# Patient Record
Sex: Female | Born: 1940
Health system: Southern US, Community
[De-identification: ages and names within clinical notes are randomized; demographics above are authoritative.]

## PROBLEM LIST (undated history)

## (undated) DIAGNOSIS — R0600 Dyspnea, unspecified: Secondary | ICD-10-CM

## (undated) DIAGNOSIS — M545 Low back pain, unspecified: Secondary | ICD-10-CM

## (undated) DIAGNOSIS — M4312 Spondylolisthesis, cervical region: Secondary | ICD-10-CM

## (undated) DIAGNOSIS — F419 Anxiety disorder, unspecified: Secondary | ICD-10-CM

## (undated) DIAGNOSIS — R3129 Other microscopic hematuria: Secondary | ICD-10-CM

## (undated) DIAGNOSIS — K219 Gastro-esophageal reflux disease without esophagitis: Secondary | ICD-10-CM

## (undated) DIAGNOSIS — R002 Palpitations: Secondary | ICD-10-CM

## (undated) DIAGNOSIS — N6459 Other signs and symptoms in breast: Secondary | ICD-10-CM

## (undated) DIAGNOSIS — J45909 Unspecified asthma, uncomplicated: Secondary | ICD-10-CM

## (undated) DIAGNOSIS — N63 Unspecified lump in unspecified breast: Secondary | ICD-10-CM

## (undated) HISTORY — DX: Dyspnea, unspecified: R06.00

## (undated) HISTORY — DX: Gastro-esophageal reflux disease without esophagitis: K21.9

## (undated) HISTORY — DX: Spondylolisthesis, cervical region: M43.12

## (undated) HISTORY — DX: Anxiety disorder, unspecified: F41.9

## (undated) HISTORY — DX: Other microscopic hematuria: R31.29

## (undated) HISTORY — DX: Unspecified asthma, uncomplicated: J45.909

## (undated) HISTORY — DX: Low back pain, unspecified: M54.50

## (undated) HISTORY — DX: Low back pain: M54.5

## (undated) HISTORY — PX: TONSILLECTOMY: SUR1361

## (undated) HISTORY — DX: Palpitations: R00.2

## (undated) HISTORY — PX: CATARACT EXTRACTION: SUR2

---

## 2001-02-18 ENCOUNTER — Ambulatory Visit (HOSPITAL_COMMUNITY): Admission: RE | Admit: 2001-02-18 | Discharge: 2001-02-18 | Payer: Self-pay | Admitting: Internal Medicine

## 2001-02-18 ENCOUNTER — Encounter: Payer: Self-pay | Admitting: Internal Medicine

## 2001-11-13 DIAGNOSIS — R002 Palpitations: Secondary | ICD-10-CM

## 2001-11-13 HISTORY — DX: Palpitations: R00.2

## 2002-01-01 ENCOUNTER — Other Ambulatory Visit: Admission: RE | Admit: 2002-01-01 | Discharge: 2002-01-01 | Payer: Self-pay | Admitting: Obstetrics and Gynecology

## 2002-03-04 ENCOUNTER — Ambulatory Visit (HOSPITAL_COMMUNITY): Admission: RE | Admit: 2002-03-04 | Discharge: 2002-03-04 | Payer: Self-pay | Admitting: *Deleted

## 2002-03-04 ENCOUNTER — Encounter: Payer: Self-pay | Admitting: Specialist

## 2002-10-06 ENCOUNTER — Ambulatory Visit (HOSPITAL_COMMUNITY): Admission: RE | Admit: 2002-10-06 | Discharge: 2002-10-06 | Payer: Self-pay | Admitting: Internal Medicine

## 2003-03-16 ENCOUNTER — Ambulatory Visit (HOSPITAL_COMMUNITY): Admission: RE | Admit: 2003-03-16 | Discharge: 2003-03-16 | Payer: Self-pay | Admitting: *Deleted

## 2003-03-16 ENCOUNTER — Encounter: Payer: Self-pay | Admitting: Specialist

## 2003-07-21 ENCOUNTER — Ambulatory Visit (HOSPITAL_COMMUNITY): Admission: RE | Admit: 2003-07-21 | Discharge: 2003-07-21 | Payer: Self-pay | Admitting: Internal Medicine

## 2003-11-14 DIAGNOSIS — R0600 Dyspnea, unspecified: Secondary | ICD-10-CM

## 2003-11-14 HISTORY — DX: Dyspnea, unspecified: R06.00

## 2004-03-21 ENCOUNTER — Ambulatory Visit (HOSPITAL_COMMUNITY): Admission: RE | Admit: 2004-03-21 | Discharge: 2004-03-21 | Payer: Self-pay | Admitting: Specialist

## 2004-08-02 ENCOUNTER — Ambulatory Visit (HOSPITAL_COMMUNITY): Admission: RE | Admit: 2004-08-02 | Discharge: 2004-08-02 | Payer: Self-pay | Admitting: Internal Medicine

## 2004-10-20 ENCOUNTER — Ambulatory Visit (HOSPITAL_COMMUNITY): Admission: RE | Admit: 2004-10-20 | Discharge: 2004-10-20 | Payer: Self-pay | Admitting: Internal Medicine

## 2004-10-27 ENCOUNTER — Ambulatory Visit (HOSPITAL_COMMUNITY): Admission: RE | Admit: 2004-10-27 | Discharge: 2004-10-27 | Payer: Self-pay | Admitting: Internal Medicine

## 2004-10-27 ENCOUNTER — Ambulatory Visit: Payer: Self-pay | Admitting: *Deleted

## 2004-11-10 ENCOUNTER — Encounter (HOSPITAL_COMMUNITY): Admission: RE | Admit: 2004-11-10 | Discharge: 2004-11-11 | Payer: Self-pay | Admitting: Internal Medicine

## 2005-04-11 ENCOUNTER — Ambulatory Visit (HOSPITAL_COMMUNITY): Admission: RE | Admit: 2005-04-11 | Discharge: 2005-04-11 | Payer: Self-pay | Admitting: Obstetrics and Gynecology

## 2006-05-22 ENCOUNTER — Ambulatory Visit (HOSPITAL_COMMUNITY): Admission: RE | Admit: 2006-05-22 | Discharge: 2006-05-22 | Payer: Self-pay | Admitting: Specialist

## 2006-06-08 ENCOUNTER — Encounter: Admission: RE | Admit: 2006-06-08 | Discharge: 2006-06-08 | Payer: Self-pay | Admitting: Specialist

## 2006-07-17 ENCOUNTER — Ambulatory Visit (HOSPITAL_COMMUNITY): Admission: RE | Admit: 2006-07-17 | Discharge: 2006-07-17 | Payer: Self-pay | Admitting: Urology

## 2006-11-19 ENCOUNTER — Encounter: Admission: RE | Admit: 2006-11-19 | Discharge: 2006-11-19 | Payer: Self-pay | Admitting: Specialist

## 2007-04-18 ENCOUNTER — Ambulatory Visit: Payer: Self-pay | Admitting: Internal Medicine

## 2007-05-02 ENCOUNTER — Ambulatory Visit (HOSPITAL_COMMUNITY): Admission: RE | Admit: 2007-05-02 | Discharge: 2007-05-02 | Payer: Self-pay | Admitting: Internal Medicine

## 2007-05-27 ENCOUNTER — Encounter: Admission: RE | Admit: 2007-05-27 | Discharge: 2007-05-27 | Payer: Self-pay | Admitting: Specialist

## 2007-06-25 ENCOUNTER — Encounter: Admission: RE | Admit: 2007-06-25 | Discharge: 2007-06-25 | Payer: Self-pay | Admitting: Obstetrics and Gynecology

## 2008-01-07 ENCOUNTER — Encounter: Admission: RE | Admit: 2008-01-07 | Discharge: 2008-01-07 | Payer: Self-pay | Admitting: Obstetrics and Gynecology

## 2008-07-06 ENCOUNTER — Encounter: Admission: RE | Admit: 2008-07-06 | Discharge: 2008-07-06 | Payer: Self-pay | Admitting: Obstetrics and Gynecology

## 2008-07-14 ENCOUNTER — Other Ambulatory Visit: Admission: RE | Admit: 2008-07-14 | Discharge: 2008-07-14 | Payer: Self-pay | Admitting: Obstetrics and Gynecology

## 2009-07-07 ENCOUNTER — Encounter: Admission: RE | Admit: 2009-07-07 | Discharge: 2009-07-07 | Payer: Self-pay | Admitting: Internal Medicine

## 2009-11-13 HISTORY — PX: COLONOSCOPY: SHX174

## 2010-05-13 ENCOUNTER — Ambulatory Visit (HOSPITAL_COMMUNITY): Admission: RE | Admit: 2010-05-13 | Discharge: 2010-05-13 | Payer: Self-pay | Admitting: Internal Medicine

## 2010-07-12 ENCOUNTER — Encounter: Admission: RE | Admit: 2010-07-12 | Discharge: 2010-07-12 | Payer: Self-pay | Admitting: Obstetrics and Gynecology

## 2010-08-29 ENCOUNTER — Other Ambulatory Visit: Admission: RE | Admit: 2010-08-29 | Discharge: 2010-08-29 | Payer: Self-pay | Admitting: Obstetrics and Gynecology

## 2010-09-07 ENCOUNTER — Ambulatory Visit (HOSPITAL_COMMUNITY): Admission: RE | Admit: 2010-09-07 | Discharge: 2010-09-07 | Payer: Self-pay | Admitting: Internal Medicine

## 2010-09-07 ENCOUNTER — Ambulatory Visit: Payer: Self-pay | Admitting: Internal Medicine

## 2010-12-04 ENCOUNTER — Encounter: Payer: Self-pay | Admitting: Specialist

## 2011-03-28 NOTE — Assessment & Plan Note (Signed)
NAMEMarland Kitchen  DELANE, STALLING               CHART#:  [id number]   DATE:  04/18/2007                       DOB:  28-Aug-1941   PRESENT COMPLAINT:  Dysphagia.   SUBJECTIVE:  Mckenzie Collins is a 70 year old Caucasian female who has history of  small sliding hiatal hernia.  Ordered an EGD on September, 7, 2004.  At  that time, she had gastritis but her H-pylori serology was negative.  Patient has been experiencing intermittent dysphagia over the last few  months.  She points to her suprasternal area.  She feels food gets  caught there.  It quickly passes down.  She has noted difficulty with  dry foods.  She saw Dr. Ouida Sills and was put on Nexium.  She has taken  Nexium for one month but really cannot tell any difference.  She states  she does not have heartburn very often.  It occurs with certain foods  but she watches her diet very closely.  She denies chronic cough, sore  throat or hoarseness.  She has a good appetite.  She denies abdominal  pain, melena or rectal bleeding.   MEDICATIONS:  1. She is on aspirin 81 mg q.d., M.V.I. q.d.  2. Calcium.  3. __________  600 mg q.d.  4. Nexium 40 mg q.a.m.   PAST MEDICAL HISTORY:  History of GERD and small sliding hiatal hernia.  She had EGD in September of 2004.  She had small sliding hiatal hernia  with incomplete or wide open ring at G junction.  Examination of polyp  reveals some nodularity in polyps.  Biopsy revealed some inflammation  with intestinal metaplasia.  No adenomatous changes are noted.   Mckenzie Collins had a colonoscopy in April of 2001 because of constipation.  She  had external hemorrhoids.  Otherwise, normal exam.  She has history of  asthma and has had tonsillectomy.   ALLERGIES:  No known drug allergies.   FAMILY HISTORY:  Negative for colorectal carcinoma but father died of  pancreatic carcinoma at age 44.  Mother lived to be 95.  She has a  sister and brother in good health.  Another brother is deceased  secondary to cardiac problems.   SOCIAL HISTORY:  She is married.  She has two children.  She is retired.  She has never smoked cigarettes and drinks alcohol very occasionally.   OBJECTIVE:  VITAL SIGNS:  Weight 135 pounds.  She is 5 feet 4 inches  tall.  Pulse 76 per minute, blood pressure 118/78, temperature 98.1.  HEENT:  Conjunctiva pink.  Sclera nonicteric.  Oropharyngeal mucosa is  normal.  NECK:  No neck masses are noted.  CARDIAC:  Regular rhythm, normal S1, S2.  No murmur, rub or gallop  noted.  LUNGS:  Are clear to auscultation.  ABDOMEN:  Flat, soft, nontender without organomegaly or masses.  EXTREMITIES:  She does not have peripheral edema or clubbing.   ASSESSMENT:  Mckenzie Collins presents with intermittent dysphagia of few months  duration.  She points to her suprasternal area as site of bolus  obstruction, which she describes as a catch.  Food always passes down.  She has difficulty with dry foods.  She has been on Nexium for one month  but cannot tell any difference.  She is not having frequent heartburn.  Her last esophagogastroduodenoscopy revealed small sliding hiatal hernia  and  incomplete ring.  I am not convinced that her dysphagia is due to  Schatzki ring.  She may have a web or even  Zenker diverticulum.  She  will be further evaluated with barium full study prior to considering  endoscopic intervention.   RECOMMENDATIONS:  I would like for her to continue Nexium for another  four weeks, samples given.  She will continue anti-reflux measures as  before.  Barium __________  to be performed __________  in near future.  We appreciate the opportunity to participate in the care of this nice  lady.       Lionel December, M.D.  Electronically Signed     NR/MEDQ  D:  04/18/2007  T:  04/19/2007  Job:  [id number]   cc:   Kingsley Callander. Ouida Sills, MD

## 2011-03-31 NOTE — Procedures (Signed)
   NAME:  Mckenzie Collins, Mckenzie Collins                        ACCOUNT NO.:  [id number]   MEDICAL RECORD NO.:  [id number]                   PATIENT TYPE:  OUT   LOCATION:  RAD                                  FACILITY:  [facility]   PHYSICIAN:  [place] Bing, M.D. [facility]           DATE OF BIRTH:  21-Aug-1941   DATE OF PROCEDURE:  10/06/2002  DATE OF DISCHARGE:                                  ECHOCARDIOGRAM   CLINICAL DATA:  This 70 year old woman with dyspnea and palpitations.   IMPRESSION:  1. A technically adequate echocardiographic study.  2. Left atrial size at the upper limits of normal; normal right atrium and     right ventricle.  3. Minimal aortic valvular sclerosis.  4. Mild mitral valve thickening with normal function.  5. Normal tricuspid and pulmonic valves.  6. Normal internal dimension, wall thickness, regional and global function     of the left ventricle.  7. Normal IVC.                                               Elverson Bing, M.D. [facility]    RR/MEDQ  D:  10/06/2002  T:  10/06/2002  Job:  [id number]

## 2011-03-31 NOTE — Procedures (Signed)
NAMESOLEDAD, BUDREAU NO.:  [id number]   MEDICAL RECORD NO.:  [id number]          PATIENT TYPE:  OUT   LOCATION:  RAD                           FACILITY:  [facility]   PHYSICIAN:  Vida Roller, M.D.   DATE OF BIRTH:  09-29-1941   DATE OF PROCEDURE:  10/27/2004  DATE OF DISCHARGE:                                  ECHOCARDIOGRAM   REFERRING PHYSICIAN:  Dr. Ouida Sills.   Tape number [id number].  Tape count [id number].   INDICATIONS FOR PROCEDURE:  This is a 70 year old woman with shortness of  breath.  No previous echocardiograms were done.  The echocardiogram was  performed on October 27, 2004.  Technical quality study was adequate.   TRACINGS:   Aorta is 28 mm.   Left atrium is 34 mm.   Septum is 10 mm.   Posterior wall is 9 mm.   Left anterior diastolic dimension 37 mm.   Left ventricular systolic dimension 27 mm.   TWO-DIMENSIONAL ECHOCARDIOGRAM AND DOPPLER IMAGING:   The left ventricle is normal size with normal systolic function.  Estimated  ejection fraction 55-60%.  There are no wall motion abnormalities seen.  Diastolic function is not adequately assessed.   The right ventricle was normal size with normal systolic function.   The atria are both top normal in size with a left atrium potentially being  slightly enlarged.   The aortic valve appears morphologically unremarkable with no stenosis or  regurgitation.   The mitral valve has trivial insufficiency.  No stenosis is seen.   Pulmonic valve was not well seen.   Tricuspid valve has mild regurgitation.  The regurgitation jet was not  adequate to assess systolic pressure.   There is no pericardial effusion.   The inferior vena cava appears to be normal size.   The ascending aorta was not well seen.     Trey Paula   JH/MEDQ  D:  11/16/2004  T:  11/16/2004  Job:  [id number]

## 2011-03-31 NOTE — Procedures (Signed)
NAMETANYLAH, SCHNOEBELEN NO.:  [id number]   MEDICAL RECORD NO.:  [id number]          PATIENT TYPE:  OUT   LOCATION:  RAD                           FACILITY:  [facility]   PHYSICIAN:  Kingsley Callander. Ouida Sills, MD       DATE OF BIRTH:  1941/05/17   DATE OF PROCEDURE:  DATE OF DISCHARGE:  10/27/2004                                    STRESS TEST   HISTORY OF PRESENT ILLNESS:  Ms. O'Bryant underwent a Cardiolite stress test  to evaluate symptoms of shortness of breath.  She exercised 9 minutes,  (completing stage 3 of the Bruce protocol), obtaining a maximal heart rate  of 168 (106% of the age-predicted maximal heart rate), at a work-load of  10.1 mets, and discontinued exercise after surpassing her target heart rate.  There were no symptoms of chest pain.  There were no arrhythmias.  There  were no ST-segment changes diagnostic of ischemia.  Baseline  electrocardiogram revealed normal sinus rhythm at 89 beats per minute.   IMPRESSION:  No evidence of exercise-induced ischemia.  Cardiolite images  pending.     Channing Mutters   ROF/MEDQ  D:  11/10/2004  T:  11/10/2004  Job:  [id number]

## 2011-03-31 NOTE — Op Note (Signed)
NAME:  Mckenzie Collins, Mckenzie Collins                        ACCOUNT NO.:  [id number]   MEDICAL RECORD NO.:  [id number]                   PATIENT TYPE:  AMB   LOCATION:  DAY                                  FACILITY:  [facility]   PHYSICIAN:  Lionel December, M.D.                 DATE OF BIRTH:  [birth year]   DATE OF PROCEDURE:  07/21/2003  DATE OF DISCHARGE:                                 OPERATIVE REPORT   PROCEDURE:  Esophagogastroduodenoscopy.   INDICATIONS FOR PROCEDURE:  [name] is a 70 year old Caucasian female who has  had symptoms of GERD for the last three to four years.  Lately, she has had  retrosternal discomfort which is worse after a heavy meal or fatty foods.  She has been on Aciphex for eight weeks and does not feel a lot better.  Her  H pylori serology in the past has been negative and so has been an  ultrasound done about two years ago.  The procedure was reviewed with the  patient, and informed consent was obtained.   PREOPERATIVE MEDICATIONS:  Cetacaine spray for pharyngeal topical  anesthesia, Demerol 50 mg IV, Versed 5 mg IV.   FINDINGS:  The procedure was performed in the endoscopy suite.  The  patient's vital signs and O2 saturations were monitored during the procedure  and remained stable.  The patient was placed in the left lateral recumbent  position.  The Olympus videoscope was passed via the oropharynx without any  difficulty into the esophagus.   Esophagus:  The mucosa of the esophagus was normal.  The squamocolumnar  junction was located at 34 cm from the incisors.  The GE junction was wide  open, but she had an incomplete ring.  The diaphragmatic hiatus was at 38  cm.  She had a 4-cm long sliding hiatal hernia.  The mucosa of the hernia  and part of the stomach was normal.   Stomach:  The very large stomach was empty.  It distended very well with  insufflation.  The folds of the proximal stomach were normal.  Examination  of  the mucosa at the gastric body, antrum,  pyloric channel, as well as  angularis, fundus, and cardia was normal.   Duodenum:  Examination of the bulb revealed mucosal nodularity, and some of  these nodules looked like small polyps.  These were biopsied for routine  histology.  The mucosa and folds of the second part of the duodenum were  normal.  The endoscope was withdrawn.  The patient tolerated the procedure  well.   FINAL DIAGNOSES:  1. A 4-cm size sliding hiatal hernia with incomplete/wide open ring at     gastroesophageal junction.  2. Normal examination of the stomach.  3. Mucosal nodularity at bulb with small polyps.  This is possibly Brunner     gland hypertrophy.  Biopsy was taken for histology.    RECOMMENDATIONS:  She will continue  antireflux measures as before.  Will  increase her Aciphex to 20 mg p.o. b.i.d., and I will contact the patient  with the biopsy results.  She is to follow up with Dr. Ouida Sills in four weeks.  Unless she is dramatically improved, will consider esophageal manometry and  a pH study.                                               Lionel December, M.D.    NR/MEDQ  D:  07/21/2003  T:  07/21/2003  Job:  [id number]   cc:   Kingsley Callander. Ouida Sills, M.D.  [address]  [place]  [place] [address]  Fax: [phone]

## 2011-06-05 ENCOUNTER — Other Ambulatory Visit: Payer: Self-pay | Admitting: Internal Medicine

## 2011-06-05 DIAGNOSIS — Z1231 Encounter for screening mammogram for malignant neoplasm of breast: Secondary | ICD-10-CM

## 2011-06-21 ENCOUNTER — Encounter: Payer: Self-pay | Admitting: Cardiology

## 2011-06-21 ENCOUNTER — Ambulatory Visit (INDEPENDENT_AMBULATORY_CARE_PROVIDER_SITE_OTHER): Payer: Medicare Other | Admitting: Cardiology

## 2011-06-21 DIAGNOSIS — F419 Anxiety disorder, unspecified: Secondary | ICD-10-CM

## 2011-06-21 DIAGNOSIS — R079 Chest pain, unspecified: Secondary | ICD-10-CM | POA: Insufficient documentation

## 2011-06-21 DIAGNOSIS — R002 Palpitations: Secondary | ICD-10-CM | POA: Insufficient documentation

## 2011-06-21 DIAGNOSIS — K219 Gastro-esophageal reflux disease without esophagitis: Secondary | ICD-10-CM | POA: Insufficient documentation

## 2011-06-21 DIAGNOSIS — M810 Age-related osteoporosis without current pathological fracture: Secondary | ICD-10-CM | POA: Insufficient documentation

## 2011-06-21 LAB — TSH: TSH: 1.75 u[IU]/mL (ref 0.350–4.500)

## 2011-06-21 NOTE — Patient Instructions (Signed)
Your physician recommends that you schedule a follow-up as needed. Your physician recommends that you have lab work in: TODAY AT [facility]. Your physician recommends that you continue on your current medications as directed. Please refer to the Current Medication list given to you today.

## 2011-06-21 NOTE — Assessment & Plan Note (Addendum)
Chest discomfort is highly atypical, and the risk that coronary disease is the etiology for same is minimal.  Stress testing in this setting would not likely provide useful information, and cardiac catheterization is not warranted.  Accordingly, reassurance is all that is appropriate at present.  Patient will call should symptoms worsen or change significantly.

## 2011-06-21 NOTE — Assessment & Plan Note (Addendum)
Palpitations are brief and are not significantly impairing quality of life.  Likely etiology remains inconsequential supraventricular ectopy as was noted on her Holter a decade ago.  No further testing or treatment is necessary at present.

## 2011-06-21 NOTE — Progress Notes (Signed)
HPI:  Ms. O'Bryant is seen in consultation at the kind request of Dr. Ouida Sills for evaluation of chest discomfort and palpitations.  This nice woman has intermittently been troubled by minor cardiovascular issues.  A Holter monitor in 2003, performed for palpitations, revealed short runs of nonsustained supraventricular tachycardia and PACs.  No specific therapy was offered or required.    A stress nuclear study and echocardiogram performed in 2005 for evaluation of exertional dyspnea were negative.  Since then, she has occasional episodes of brief palpitations lasting seconds or sharp localized left chest discomfort lasting seconds.  There is no relationship to exertion.  There may be some association with anxiety.  She does not find these symptoms particularly troublesome.  Chest discomfort is mild, nonradiating and unassociated with other factors.  She is unaware of anything she can do to bring on the discomfort or to resolve it.    Current Outpatient Prescriptions  Medication Sig Dispense Refill  . alendronate (FOSAMAX) 70 MG tablet Take 70 mg by mouth every 7 (seven) days.       Marland Kitchen aspirin 81 MG tablet Take 81 mg by mouth daily.        . calcium citrate-vitamin D (CITRACAL+D) 315-200 MG-UNIT per tablet Take 1 tablet by mouth daily.        . Multiple Vitamins-Minerals (CENTRUM SILVER ULTRA WOMENS PO) Take 1 tablet by mouth daily.           No Known Allergies    Past Medical History  Diagnosis Date  . Gastroesophageal reflux disease     hiatal hernia; gastritis; dysphagia  . Asthma   . Dyspnea 2005    Negative stress nuclear in 2005; normal echocardiogram  . Palpitations 2003    few short runs of supraventricular tachycardia on Holter  . Chest pain   . Osteoporosis   . Microscopic hematuria   . Low back pain   . Hemorrhoids      Past Surgical History  Procedure Date  . Colonoscopy 2011  . Tonsillectomy      No family history on file.   History   Social History  . Marital  Status: Married    Spouse Name: N/A    Number of Children: N/A  . Years of Education: N/A   Occupational History  . Not on file.   Social History Main Topics  . Smoking status: Not on file  . Smokeless tobacco: Not on file  . Alcohol Use: Not on file  . Drug Use: Not on file  . Sexually Active: Not on file   Other Topics Concern  . Not on file   Social History Narrative  . No narrative on file     ROS: Requires corrective lenses; stable weight and appetite; all other systems reviewed and are negative.  PHYSICAL EXAM: BP 116/65  Pulse 80  Ht 5\' 3"  (1.6 m)  Wt 61.689 kg (136 lb)  BMI 24.09 kg/m2  SpO2 96%  General-Well-developed; no acute distress; alert and mildly anxious Body Habitus-proportionate weight and height HEENT-[place]/AT; PERRL; EOM intact; conjunctiva and lids nl Neck-No JVD; no carotid bruits Endocrine-No thyromegaly Lungs-Clear lung fields; resonant percussion; normal I-to-E ratio Cardiovascular- normal PMI; normal S1 and S2; minimal systolic murmur Abdomen-BS normal; soft and non-tender without masses or organomegaly Musculoskeletal-No deformities, cyanosis or clubbing Neurologic-Nl cranial nerves; symmetric strength and tone Skin- Warm, no significant lesions Extremities-Nl distal pulses; no edema  EKG:  Normal sinus rhythm; left atrial abnormality; right ventricular conduction delay; delayed R-wave  progression; no previous tracing for comparison.  Laboratory:  05/2011: Adine Madura blood with 0-2 RBCs, normal CBC, normal chemistry profile, Lipids: 190, 91, 62, 112.  ASSESSMENT AND PLAN:

## 2011-06-21 NOTE — Assessment & Plan Note (Addendum)
Anxiety may well account for or at least contribute to current symptoms.  Approaches to management of this problem were discussed with her.  I will be happy to see this nice woman in the future any time Dr. Ouida Sills deems appropriate.

## 2011-07-07 ENCOUNTER — Encounter: Payer: Self-pay | Admitting: *Deleted

## 2011-07-07 ENCOUNTER — Telehealth: Payer: Self-pay | Admitting: *Deleted

## 2011-07-07 NOTE — Telephone Encounter (Signed)
No answer on phone numbers listed. Will mail letter to pt.

## 2011-07-07 NOTE — Telephone Encounter (Signed)
Message copied by Halo Laski L on Fri Jul 07, 2011 12:07 PM ------      Message from: Kathlen Brunswick      Created: Thu Jun 22, 2011  6:36 PM       Lab results reviewed.      No change in Rx.

## 2011-07-18 ENCOUNTER — Ambulatory Visit
Admission: RE | Admit: 2011-07-18 | Discharge: 2011-07-18 | Disposition: A | Discharge status: Home / Self Care | Payer: Medicare Other | Source: Ambulatory Visit | Attending: Internal Medicine | Admitting: Internal Medicine

## 2011-07-18 DIAGNOSIS — Z1231 Encounter for screening mammogram for malignant neoplasm of breast: Secondary | ICD-10-CM

## 2011-10-23 ENCOUNTER — Other Ambulatory Visit: Payer: Self-pay | Admitting: Obstetrics & Gynecology

## 2012-06-14 ENCOUNTER — Other Ambulatory Visit: Payer: Self-pay | Admitting: Internal Medicine

## 2012-06-14 DIAGNOSIS — Z1231 Encounter for screening mammogram for malignant neoplasm of breast: Secondary | ICD-10-CM

## 2012-07-23 ENCOUNTER — Ambulatory Visit
Admission: RE | Admit: 2012-07-23 | Discharge: 2012-07-23 | Disposition: A | Discharge status: Home / Self Care | Payer: Medicare Other | Source: Ambulatory Visit | Attending: Internal Medicine | Admitting: Internal Medicine

## 2012-07-23 DIAGNOSIS — Z1231 Encounter for screening mammogram for malignant neoplasm of breast: Secondary | ICD-10-CM

## 2012-08-07 ENCOUNTER — Ambulatory Visit (INDEPENDENT_AMBULATORY_CARE_PROVIDER_SITE_OTHER): Payer: Medicare Other | Admitting: Internal Medicine

## 2012-11-25 ENCOUNTER — Other Ambulatory Visit: Payer: Self-pay | Admitting: Obstetrics and Gynecology

## 2012-11-25 ENCOUNTER — Other Ambulatory Visit (HOSPITAL_COMMUNITY)
Admission: RE | Admit: 2012-11-25 | Discharge: 2012-11-25 | Disposition: A | Discharge status: Home / Self Care | Payer: Medicare Other | Source: Ambulatory Visit | Attending: Obstetrics and Gynecology | Admitting: Obstetrics and Gynecology

## 2012-11-25 DIAGNOSIS — Z01419 Encounter for gynecological examination (general) (routine) without abnormal findings: Secondary | ICD-10-CM | POA: Insufficient documentation

## 2012-11-25 DIAGNOSIS — Z1151 Encounter for screening for human papillomavirus (HPV): Secondary | ICD-10-CM | POA: Insufficient documentation

## 2013-06-20 ENCOUNTER — Other Ambulatory Visit: Payer: Self-pay | Admitting: Internal Medicine

## 2013-06-20 DIAGNOSIS — Z1231 Encounter for screening mammogram for malignant neoplasm of breast: Secondary | ICD-10-CM

## 2013-06-20 DIAGNOSIS — M810 Age-related osteoporosis without current pathological fracture: Secondary | ICD-10-CM

## 2013-07-28 ENCOUNTER — Ambulatory Visit
Admission: RE | Admit: 2013-07-28 | Discharge: 2013-07-28 | Disposition: A | Discharge status: Home / Self Care | Payer: Medicare Other | Source: Ambulatory Visit | Attending: Internal Medicine | Admitting: Internal Medicine

## 2013-07-28 DIAGNOSIS — Z1231 Encounter for screening mammogram for malignant neoplasm of breast: Secondary | ICD-10-CM

## 2013-07-28 DIAGNOSIS — M810 Age-related osteoporosis without current pathological fracture: Secondary | ICD-10-CM

## 2014-02-05 ENCOUNTER — Encounter: Payer: Self-pay | Admitting: Obstetrics and Gynecology

## 2014-02-05 ENCOUNTER — Ambulatory Visit (INDEPENDENT_AMBULATORY_CARE_PROVIDER_SITE_OTHER): Payer: Medicare Other | Admitting: Obstetrics and Gynecology

## 2014-02-05 ENCOUNTER — Encounter (INDEPENDENT_AMBULATORY_CARE_PROVIDER_SITE_OTHER): Payer: Self-pay

## 2014-02-05 VITALS — BP 120/70 | Ht 65.0 in | Wt 139.6 lb

## 2014-02-05 DIAGNOSIS — Z01419 Encounter for gynecological examination (general) (routine) without abnormal findings: Secondary | ICD-10-CM | POA: Insufficient documentation

## 2014-02-05 DIAGNOSIS — Z1212 Encounter for screening for malignant neoplasm of rectum: Secondary | ICD-10-CM

## 2014-02-05 LAB — HEMOCCULT GUIAC POC 1CARD (OFFICE): Fecal Occult Blood, POC: NEGATIVE

## 2014-02-05 NOTE — Progress Notes (Signed)
Patient ID: Mckenzie Collins, female   DOB: 1941-06-13, 73 y.o.   MRN: [id number]  Assessment:  Annual Gyn Exam   Plan:  1. pap normal in 2014 2. return annually or prn 3    Annual mammogram advised  normal November 2014   Mckenzie Collins is a 73 y.o. female No obstetric history on file. who presents for annual exam. No LMP recorded. Patient is postmenopausal. The patient has complaints today of *none The following portions of the patient's history were reviewed and updated as appropriate: allergies, current medications, past family history, past medical history, past social history, past surgical history and problem list.  Review of Systems Constitutional: negative Gastrointestinal: negative Genitourinary: no sui, no urge sx  Objective:  BP 120/70  Ht 5\' 5"  (1.651 m)  Wt 139 lb 9.6 oz (63.322 kg)  BMI 23.23 kg/m2   BMI: Body mass index is 23.23 kg/(m^2).  General Appearance: Alert, appropriate appearance for age. No acute distress HEENT: Grossly normal Neck / Thyroid:  Cardiovascular: RRR; normal S1, S2, no murmur Lungs: CTA bilaterally Back: No CVAT Breast Exam: No dimpling, nipple retraction or discharge. No masses or nodes. and THERE IS BILATERAL FIRMNESS IN UPPER OUTER QUADRANTS, CONSISTENT WITH PT'S CONTINUED GOOD SUPPORT. IN STANDING POSITION, THERE IS NO FIRMESS, RETRACTION OR DIMPLING. PT'S LEFT BREAST HAS RETRACTED NIPPLE, THIS IS A LIFELONG CONDITION Gastrointestinal: Soft, non-tender, no masses or organomegaly Pelvic Exam: Vulva and vagina appear normal. Bimanual exam reveals normal uterus and adnexa. Rectal: good sphincter tone, no masses and guaiac negative EXCELLENT  support Rectovaginal: normal rectal, no masses and guaiac negative stool obtained Lymphatic Exam: Non-palpable nodes in neck, clavicular, axillary, or inguinal regions Skin: no rash or abnormalities Neurologic: Normal gait and speech, no tremor  Psychiatric: Alert and oriented, appropriate  affect.  Urinalysis:Not done  Mckenzie Collins. MD Pgr [phone] 9:03 AM

## 2014-02-05 NOTE — Addendum Note (Signed)
Addended by: Doyne Keel on: 02/05/2014 09:24 AM   Modules accepted: Orders

## 2014-02-05 NOTE — Patient Instructions (Addendum)
Continue annual exams, labs thru dr Desma Maxim is useful.

## 2014-07-09 ENCOUNTER — Other Ambulatory Visit: Payer: Self-pay

## 2014-07-09 DIAGNOSIS — Z1231 Encounter for screening mammogram for malignant neoplasm of breast: Secondary | ICD-10-CM

## 2014-08-17 ENCOUNTER — Ambulatory Visit
Admission: RE | Admit: 2014-08-17 | Discharge: 2014-08-17 | Disposition: A | Discharge status: Home / Self Care | Payer: Medicare Other | Source: Ambulatory Visit

## 2014-08-17 DIAGNOSIS — Z1231 Encounter for screening mammogram for malignant neoplasm of breast: Secondary | ICD-10-CM

## 2015-02-10 ENCOUNTER — Encounter: Payer: Self-pay | Admitting: Obstetrics and Gynecology

## 2015-02-10 ENCOUNTER — Other Ambulatory Visit (HOSPITAL_COMMUNITY)
Admission: RE | Admit: 2015-02-10 | Discharge: 2015-02-10 | Disposition: A | Discharge status: Home / Self Care | Payer: Medicare Other | Source: Ambulatory Visit | Attending: Obstetrics and Gynecology | Admitting: Obstetrics and Gynecology

## 2015-02-10 ENCOUNTER — Ambulatory Visit (INDEPENDENT_AMBULATORY_CARE_PROVIDER_SITE_OTHER): Payer: Medicare Other | Admitting: Obstetrics and Gynecology

## 2015-02-10 VITALS — BP 120/62 | Ht 63.0 in | Wt 141.5 lb

## 2015-02-10 DIAGNOSIS — Z124 Encounter for screening for malignant neoplasm of cervix: Secondary | ICD-10-CM | POA: Insufficient documentation

## 2015-02-10 DIAGNOSIS — Z1151 Encounter for screening for human papillomavirus (HPV): Secondary | ICD-10-CM | POA: Diagnosis not present

## 2015-02-10 DIAGNOSIS — Z01419 Encounter for gynecological examination (general) (routine) without abnormal findings: Secondary | ICD-10-CM | POA: Diagnosis not present

## 2015-02-10 DIAGNOSIS — Z0000 Encounter for general adult medical examination without abnormal findings: Secondary | ICD-10-CM | POA: Insufficient documentation

## 2015-02-10 NOTE — Progress Notes (Signed)
Patient ID: Mckenzie Collins, female   DOB: 26-Jul-1941, 74 y.o.   MRN: [id number] Pt here today for annual exam. Pt denies any problems or concerns at this time.

## 2015-02-10 NOTE — Addendum Note (Signed)
Addended by: Farley Ly on: 02/10/2015 10:06 AM   Modules accepted: Orders

## 2015-02-10 NOTE — Progress Notes (Signed)
Patient ID: Mckenzie Collins, female   DOB: June 25, 1941, 74 y.o.   MRN: [id number]  This chart was SCRIBED for Mallory Shirk, MD by Stephania Fragmin, ED Scribe. This patient was seen in room 1 and the patient's care was started at 9:29 AM.   Assessment:  Annual Gyn Exam   Plan:  1. pap smear, next pap due 3 years 2. return annually or prn 3    Annual mammogram advised Subjective:  Mckenzie Collins is a 74 y.o. female No obstetric history on file. who presents for annual exam. No LMP recorded. Patient is postmenopausal. Willey Blade MD manages medical care, including annual exams. Acute Concerns: The patient has no acute complaints today. She denies any urinary symptoms.  Musculoskeletal: She does mention chronic back and shoulder pain from activities of daily living. Patient has been taking Fosamax for over 5 years to promote bone density. She is considering undergoing a strength regimen, and reports walking about 1 hour 4-5 days a week.   Health Maintenance: Patient estimates that she has a colonoscopy about every 4 years. Patient's PCP is Dr. Willey Blade, with whom she has annual physical exams, including blood lab tests.  The following portions of the patient's history were reviewed and updated as appropriate: allergies, current medications, past family history, past medical history, past social history, past surgical history and problem list. Past Medical History  Diagnosis Date  . Gastroesophageal reflux disease     hiatal hernia; gastritis; dysphagia  . Asthma     In childhood  . Dyspnea 2005    Negative stress nuclear in 2005; normal echocardiogram  . Palpitations 2003    few short runs of supraventricular tachycardia on Holter  . Chest pain   . Osteoporosis   . Microscopic hematuria   . Low back pain   . Hemorrhoids     Past Surgical History  Procedure Laterality Date  . Colonoscopy  2011  . Tonsillectomy       Current outpatient prescriptions:  .  alendronate (FOSAMAX) 70 MG tablet,  Take 70 mg by mouth every 7 (seven) days. , Disp: , Rfl:  .  aspirin 81 MG tablet, Take 81 mg by mouth daily.  , Disp: , Rfl:  .  calcium citrate-vitamin D (CITRACAL+D) 315-200 MG-UNIT per tablet, Take 1 tablet by mouth daily.  , Disp: , Rfl:  .  Multiple Vitamins-Minerals (CENTRUM SILVER ULTRA WOMENS PO), Take 1 tablet by mouth daily.  , Disp: , Rfl:   Review of Systems Constitutional: negative Gastrointestinal: negative Genitourinary: negative A complete review of systems was obtained and all systems are negative except as noted in the HPI and PMH.    Objective:  BP 120/62 mmHg  Ht 5\' 3"  (1.6 m)  Wt 141 lb 8 oz (64.184 kg)  BMI 25.07 kg/m2   BMI: Body mass index is 25.07 kg/(m^2).  General Appearance: Alert, appropriate appearance for age. No acute distress HEENT: Grossly normal Neck / Thyroid:  Cardiovascular: RRR; normal S1, S2, no murmur Lungs: CTA bilaterally Back: No CVAT Breast Exam: No dimpling, nipple retraction or discharge. No masses or nodes. and No masses or nodes.No dimpling, nipple retraction or discharge. Gastrointestinal: Soft, non-tender, no masses or organomegaly Pelvic Exam: External genitalia: normal general appearance Urinary system: urethral meatus normal Vaginal: normal mucosa without prolapse or lesions Cervix: normal appearance, tiny Adnexa: normal bimanual exam Uterus: normal single, nontender, tiny, mobile Rectal: good sphincter tone, no rectocele, Guaic negative Rectovaginal: not indicated Lymphatic Exam: Non-palpable nodes in neck,  clavicular, axillary, or inguinal regions  Skin: no rash or abnormalities Neurologic: Normal gait and speech, no tremor  Psychiatric: Alert and oriented, appropriate affect.  Urinalysis:Not done  Mallory Shirk. MD Pgr [phone] 9:29 AM    I personally performed the services described in this documentation, which was SCRIBED in my presence. The recorded information has been reviewed and considered accurate. It  has been edited as necessary during review. Jonnie Kind, MD

## 2015-02-11 LAB — CYTOLOGY - PAP

## 2015-06-30 DIAGNOSIS — M810 Age-related osteoporosis without current pathological fracture: Secondary | ICD-10-CM | POA: Diagnosis not present

## 2015-06-30 DIAGNOSIS — M1990 Unspecified osteoarthritis, unspecified site: Secondary | ICD-10-CM | POA: Diagnosis not present

## 2015-06-30 DIAGNOSIS — K219 Gastro-esophageal reflux disease without esophagitis: Secondary | ICD-10-CM | POA: Diagnosis not present

## 2015-06-30 DIAGNOSIS — Z79899 Other long term (current) drug therapy: Secondary | ICD-10-CM | POA: Diagnosis not present

## 2015-07-08 DIAGNOSIS — Z6824 Body mass index (BMI) 24.0-24.9, adult: Secondary | ICD-10-CM | POA: Diagnosis not present

## 2015-07-08 DIAGNOSIS — R310 Gross hematuria: Secondary | ICD-10-CM | POA: Diagnosis not present

## 2015-07-08 DIAGNOSIS — M810 Age-related osteoporosis without current pathological fracture: Secondary | ICD-10-CM | POA: Diagnosis not present

## 2015-07-08 DIAGNOSIS — Z0001 Encounter for general adult medical examination with abnormal findings: Secondary | ICD-10-CM | POA: Diagnosis not present

## 2015-07-26 ENCOUNTER — Other Ambulatory Visit (HOSPITAL_COMMUNITY): Payer: Self-pay | Admitting: Internal Medicine

## 2015-07-26 DIAGNOSIS — M810 Age-related osteoporosis without current pathological fracture: Secondary | ICD-10-CM

## 2015-07-27 ENCOUNTER — Other Ambulatory Visit (HOSPITAL_COMMUNITY): Payer: Self-pay | Admitting: Internal Medicine

## 2015-07-27 DIAGNOSIS — M810 Age-related osteoporosis without current pathological fracture: Secondary | ICD-10-CM

## 2015-07-27 DIAGNOSIS — Z1231 Encounter for screening mammogram for malignant neoplasm of breast: Secondary | ICD-10-CM

## 2015-07-29 ENCOUNTER — Ambulatory Visit (INDEPENDENT_AMBULATORY_CARE_PROVIDER_SITE_OTHER): Payer: Medicare Other | Admitting: Otolaryngology

## 2015-07-29 DIAGNOSIS — H903 Sensorineural hearing loss, bilateral: Secondary | ICD-10-CM

## 2015-07-29 DIAGNOSIS — H6123 Impacted cerumen, bilateral: Secondary | ICD-10-CM | POA: Diagnosis not present

## 2015-07-30 ENCOUNTER — Other Ambulatory Visit (HOSPITAL_COMMUNITY): Payer: Self-pay

## 2015-09-02 ENCOUNTER — Ambulatory Visit
Admission: RE | Admit: 2015-09-02 | Discharge: 2015-09-02 | Disposition: A | Discharge status: Home / Self Care | Payer: Medicare Other | Source: Ambulatory Visit | Attending: Internal Medicine | Admitting: Internal Medicine

## 2015-09-02 ENCOUNTER — Other Ambulatory Visit (HOSPITAL_COMMUNITY): Payer: Self-pay | Admitting: Internal Medicine

## 2015-09-02 DIAGNOSIS — M810 Age-related osteoporosis without current pathological fracture: Secondary | ICD-10-CM

## 2015-09-02 DIAGNOSIS — Z1231 Encounter for screening mammogram for malignant neoplasm of breast: Secondary | ICD-10-CM

## 2015-09-23 DIAGNOSIS — Z23 Encounter for immunization: Secondary | ICD-10-CM | POA: Diagnosis not present

## 2015-10-05 ENCOUNTER — Ambulatory Visit
Admission: RE | Admit: 2015-10-05 | Discharge: 2015-10-05 | Disposition: A | Discharge status: Home / Self Care | Payer: Medicare Other | Source: Ambulatory Visit | Attending: Internal Medicine | Admitting: Internal Medicine

## 2015-10-05 DIAGNOSIS — M8589 Other specified disorders of bone density and structure, multiple sites: Secondary | ICD-10-CM | POA: Diagnosis not present

## 2015-10-05 DIAGNOSIS — Z1231 Encounter for screening mammogram for malignant neoplasm of breast: Secondary | ICD-10-CM | POA: Diagnosis not present

## 2016-03-09 DIAGNOSIS — H52223 Regular astigmatism, bilateral: Secondary | ICD-10-CM | POA: Diagnosis not present

## 2016-03-09 DIAGNOSIS — H5213 Myopia, bilateral: Secondary | ICD-10-CM | POA: Diagnosis not present

## 2016-03-09 DIAGNOSIS — H524 Presbyopia: Secondary | ICD-10-CM | POA: Diagnosis not present

## 2016-03-09 DIAGNOSIS — H25811 Combined forms of age-related cataract, right eye: Secondary | ICD-10-CM | POA: Diagnosis not present

## 2016-07-05 DIAGNOSIS — M810 Age-related osteoporosis without current pathological fracture: Secondary | ICD-10-CM | POA: Diagnosis not present

## 2016-07-05 DIAGNOSIS — Z79899 Other long term (current) drug therapy: Secondary | ICD-10-CM | POA: Diagnosis not present

## 2016-07-13 ENCOUNTER — Other Ambulatory Visit (HOSPITAL_COMMUNITY): Payer: Self-pay | Admitting: Internal Medicine

## 2016-07-13 ENCOUNTER — Ambulatory Visit (HOSPITAL_COMMUNITY)
Admission: RE | Admit: 2016-07-13 | Discharge: 2016-07-13 | Disposition: A | Discharge status: Home / Self Care | Payer: Medicare Other | Source: Ambulatory Visit | Attending: Internal Medicine | Admitting: Internal Medicine

## 2016-07-13 DIAGNOSIS — R000 Tachycardia, unspecified: Secondary | ICD-10-CM | POA: Diagnosis not present

## 2016-07-13 DIAGNOSIS — M810 Age-related osteoporosis without current pathological fracture: Secondary | ICD-10-CM | POA: Diagnosis not present

## 2016-07-13 DIAGNOSIS — M50321 Other cervical disc degeneration at C4-C5 level: Secondary | ICD-10-CM | POA: Insufficient documentation

## 2016-07-13 DIAGNOSIS — R52 Pain, unspecified: Secondary | ICD-10-CM | POA: Diagnosis not present

## 2016-07-13 DIAGNOSIS — M542 Cervicalgia: Secondary | ICD-10-CM | POA: Diagnosis present

## 2016-07-13 DIAGNOSIS — M50221 Other cervical disc displacement at C4-C5 level: Secondary | ICD-10-CM | POA: Diagnosis not present

## 2016-07-13 DIAGNOSIS — Z0001 Encounter for general adult medical examination with abnormal findings: Secondary | ICD-10-CM | POA: Diagnosis not present

## 2016-08-07 ENCOUNTER — Ambulatory Visit (INDEPENDENT_AMBULATORY_CARE_PROVIDER_SITE_OTHER): Payer: Medicare Other | Admitting: Otolaryngology

## 2016-08-07 DIAGNOSIS — H6123 Impacted cerumen, bilateral: Secondary | ICD-10-CM | POA: Diagnosis not present

## 2016-08-07 DIAGNOSIS — R42 Dizziness and giddiness: Secondary | ICD-10-CM | POA: Diagnosis not present

## 2016-08-07 DIAGNOSIS — R51 Headache: Secondary | ICD-10-CM | POA: Diagnosis not present

## 2016-09-12 ENCOUNTER — Other Ambulatory Visit: Payer: Self-pay | Admitting: Internal Medicine

## 2016-09-12 DIAGNOSIS — Z1231 Encounter for screening mammogram for malignant neoplasm of breast: Secondary | ICD-10-CM

## 2016-09-19 DIAGNOSIS — Z23 Encounter for immunization: Secondary | ICD-10-CM | POA: Diagnosis not present

## 2016-10-11 ENCOUNTER — Ambulatory Visit
Admission: RE | Admit: 2016-10-11 | Discharge: 2016-10-11 | Disposition: A | Discharge status: Home / Self Care | Payer: Medicare Other | Source: Ambulatory Visit | Attending: Internal Medicine | Admitting: Internal Medicine

## 2016-10-11 DIAGNOSIS — Z1231 Encounter for screening mammogram for malignant neoplasm of breast: Secondary | ICD-10-CM

## 2016-11-21 DIAGNOSIS — M1712 Unilateral primary osteoarthritis, left knee: Secondary | ICD-10-CM | POA: Diagnosis not present

## 2016-11-21 DIAGNOSIS — Z6824 Body mass index (BMI) 24.0-24.9, adult: Secondary | ICD-10-CM | POA: Diagnosis not present

## 2016-12-20 ENCOUNTER — Encounter: Payer: Self-pay | Admitting: Adult Health

## 2016-12-20 ENCOUNTER — Ambulatory Visit (INDEPENDENT_AMBULATORY_CARE_PROVIDER_SITE_OTHER): Payer: Medicare Other | Admitting: Adult Health

## 2016-12-20 VITALS — BP 126/74 | HR 84 | Ht 64.0 in | Wt 141.4 lb

## 2016-12-20 DIAGNOSIS — N649 Disorder of breast, unspecified: Secondary | ICD-10-CM | POA: Diagnosis not present

## 2016-12-20 DIAGNOSIS — N6459 Other signs and symptoms in breast: Secondary | ICD-10-CM | POA: Diagnosis not present

## 2016-12-20 MED ORDER — NYSTATIN-TRIAMCINOLONE 100000-0.1 UNIT/GM-% EX CREA: 1.0000 "application " | TOPICAL_CREAM | Freq: Two times a day (BID) | CUTANEOUS | 0 refills | Status: DC

## 2016-12-20 NOTE — Progress Notes (Signed)
Subjective:     Patient ID: Mckenzie Collins, female   DOB: 1941/01/06, 76 y.o.   MRN: [id number]  HPI Mckenzie Collins is a 76 year old white female in complaining of are left nipple for about 2 weeks that is itchy.  She had normal mammogram in November.   Review of Systems Area left nipple that is itchy Reviewed past medical,surgical, social and family history. Reviewed medications and allergies.     Objective:   Physical Exam BP 126/74 (BP Location: Right Arm, Patient Position: Sitting, Cuff Size: Normal)   Pulse 84   Ht 5\' 4"  (1.626 m)   Wt 141 lb 6.4 oz (64.1 kg)   BMI 24.27 kg/m   PHQ 2 score 0.   Skin warm and dry,  Breasts:no dominate palpable mass, retraction or nipple discharge on right, on left no masses, or nipple discharge, left nipple is inverted but has been and now has thickness on nipple areola area, scaly and she has noticed itching.   Will give mytrex cream, and get diagnostic mammogram and [place].  Assessment:     1. Nipple lesion   2. Inversion of nipple       Plan:     Rx mytrex cream use bid for next 2 weeks #15 gm  Get diagnotic left mammogram and left [place] 2/12 at the breast center in [place] Follow up in 2 weeks

## 2016-12-25 ENCOUNTER — Telehealth: Payer: Self-pay | Admitting: Adult Health

## 2016-12-25 ENCOUNTER — Ambulatory Visit
Admission: RE | Admit: 2016-12-25 | Discharge: 2016-12-25 | Disposition: A | Discharge status: Home / Self Care | Payer: Medicare Other | Source: Ambulatory Visit | Attending: Adult Health | Admitting: Adult Health

## 2016-12-25 DIAGNOSIS — N649 Disorder of breast, unspecified: Secondary | ICD-10-CM

## 2016-12-25 DIAGNOSIS — N6489 Other specified disorders of breast: Secondary | ICD-10-CM | POA: Diagnosis not present

## 2016-12-25 DIAGNOSIS — R922 Inconclusive mammogram: Secondary | ICD-10-CM | POA: Diagnosis not present

## 2016-12-25 DIAGNOSIS — N6459 Other signs and symptoms in breast: Secondary | ICD-10-CM

## 2016-12-25 HISTORY — DX: Unspecified lump in unspecified breast: N63.0

## 2016-12-25 HISTORY — DX: Other signs and symptoms in breast: N64.59

## 2016-12-25 NOTE — Telephone Encounter (Signed)
Pt aware of mammogram and [place] and has F/U appt 2/21 if lesion still there will refer for biopsy.use cream bid

## 2017-01-03 ENCOUNTER — Ambulatory Visit (INDEPENDENT_AMBULATORY_CARE_PROVIDER_SITE_OTHER): Payer: Medicare Other | Admitting: Adult Health

## 2017-01-03 ENCOUNTER — Encounter: Payer: Self-pay | Admitting: Adult Health

## 2017-01-03 VITALS — BP 130/60 | HR 96 | Ht 64.0 in | Wt 141.0 lb

## 2017-01-03 DIAGNOSIS — N649 Disorder of breast, unspecified: Secondary | ICD-10-CM | POA: Diagnosis not present

## 2017-01-03 NOTE — Patient Instructions (Signed)
Return in 1 week for biopsy left breast skin lesion Continue mytrex

## 2017-01-03 NOTE — Progress Notes (Signed)
Subjective:     Patient ID: Mckenzie Collins, female   DOB: 01-03-1941, 76 y.o.   MRN: [id number]  HPI Mckenzie Collins is a 76 year old white female, back in follow up of lesion on left nipple, no change.   Review of Systems Left breast nipple lesion,no change  Reviewed past medical,surgical, social and family history. Reviewed medications and allergies.     Objective:   Physical Exam BP 130/60 (BP Location: Left Arm, Patient Position: Sitting, Cuff Size: Normal)   Pulse 96   Ht 5\' 4"  (1.626 m)   Wt 141 lb (64 kg)   BMI 24.20 kg/m   PHQ 2 score. Skin warm and dry, lesion unchanged left nipple, still looks scaly with demarcation of edges, Dr Glo Herring in to co exam, and he said he could do punch biopsy.Mammogram was normal.    Assessment:     1. Nipple lesion       Plan:     Continue mytrex cream Return in 1 week for biopsy of skin lesion left breast nipple

## 2017-01-18 ENCOUNTER — Ambulatory Visit: Payer: Self-pay | Admitting: Obstetrics and Gynecology

## 2017-01-23 ENCOUNTER — Encounter: Payer: Self-pay | Admitting: Obstetrics and Gynecology

## 2017-01-23 ENCOUNTER — Ambulatory Visit (INDEPENDENT_AMBULATORY_CARE_PROVIDER_SITE_OTHER): Payer: Medicare Other | Admitting: Obstetrics and Gynecology

## 2017-01-23 VITALS — BP 126/74 | HR 98 | Wt 140.0 lb

## 2017-01-23 DIAGNOSIS — N649 Disorder of breast, unspecified: Secondary | ICD-10-CM

## 2017-01-23 DIAGNOSIS — L309 Dermatitis, unspecified: Secondary | ICD-10-CM | POA: Diagnosis not present

## 2017-01-23 NOTE — Progress Notes (Signed)
   [facility] Visit  @DATE @            Patient name: Mckenzie Collins MRN [id number]  Date of birth: 02-23-1941  CC & HPI:  BRYAN OMURA is a 76 y.o. female presenting today for follow-up of herarea of concern on the nipple of her left breast. She's had long-standing lifelong nipple inversion and was seen by Anderson Malta recently with an area that was somewhat swollen 2 mm to 3 mm diameter in the upper one third of the areola. This was being considered for diagnostic excisional biopsy she seen today in reassessment, has got much better with topical antifungals and  II weeks' time  ROS:  ROS   Pertinent History Reviewed:   Reviewed: Significant for no bleeding discharge or swelling or adenopathy of the left breast Medical         Past Medical History:  Diagnosis Date  . Asthma    In childhood  . Breast mass left   skin lesion around 11-12 x 2 -3 weeks  . Chest pain   . Dyspnea 2005   Negative stress nuclear in 2005; normal echocardiogram  . Gastroesophageal reflux disease    hiatal hernia; gastritis; dysphagia  . Hemorrhoids   . Inverted nipple left   always  . Low back pain   . Microscopic hematuria   . Osteoporosis   . Palpitations 2003   few short runs of supraventricular tachycardia on Holter                              Surgical Hx:    Past Surgical History:  Procedure Laterality Date  . COLONOSCOPY  2011  . TONSILLECTOMY     Medications: Reviewed & Updated - see associated section                       Current Outpatient Prescriptions:  .  aspirin 81 MG tablet, Take 81 mg by mouth daily.  , Disp: , Rfl:  .  calcium citrate-vitamin D (CITRACAL+D) 315-200 MG-UNIT per tablet, Take 1 tablet by mouth daily.  , Disp: , Rfl:  .  Multiple Vitamins-Minerals (CENTRUM SILVER ULTRA WOMENS PO), Take 1 tablet by mouth daily.  , Disp: , Rfl:  .  nystatin-triamcinolone (MYCOLOG II) cream, Apply 1 application topically 2 (two) times daily., Disp: 15 g, Rfl: 0   Social  History: Reviewed -  reports that she has never smoked. She has never used smokeless tobacco.  Objective Findings:  Vitals: Blood pressure 126/74, pulse 98, weight 140 lb (63.5 kg).  Physical Examination: General appearance - alert, well appearing, and in no distress and oriented to person, place, and time Mental status - alert, oriented to person, place, and time, normal mood, behavior, speech, dress, motor activity, and thought processes Chest - clear to auscultation, no wheezes, rales or rhonchi, symmetric air entry Breasts - breasts appear normal, no suspicious masses, no skin or nipple changes or axillary nodes, right breast normal without mass, skin or nipple changes or axillary nodes, left breast normal without mass, skin or nipple changes or axillary nodes   Assessment & Plan:   A:  1. resolvedsuperficial left breast skin swelling  P:  1. Patient may discontinue and not topical nystatin 2. No surgery planned

## 2017-06-07 DIAGNOSIS — H524 Presbyopia: Secondary | ICD-10-CM | POA: Diagnosis not present

## 2017-06-07 DIAGNOSIS — H5213 Myopia, bilateral: Secondary | ICD-10-CM | POA: Diagnosis not present

## 2017-06-07 DIAGNOSIS — H52223 Regular astigmatism, bilateral: Secondary | ICD-10-CM | POA: Diagnosis not present

## 2017-06-07 DIAGNOSIS — H25811 Combined forms of age-related cataract, right eye: Secondary | ICD-10-CM | POA: Diagnosis not present

## 2017-07-17 DIAGNOSIS — Z79899 Other long term (current) drug therapy: Secondary | ICD-10-CM | POA: Diagnosis not present

## 2017-07-17 DIAGNOSIS — M810 Age-related osteoporosis without current pathological fracture: Secondary | ICD-10-CM | POA: Diagnosis not present

## 2017-07-17 DIAGNOSIS — M1990 Unspecified osteoarthritis, unspecified site: Secondary | ICD-10-CM | POA: Diagnosis not present

## 2017-07-20 DIAGNOSIS — L03114 Cellulitis of left upper limb: Secondary | ICD-10-CM | POA: Diagnosis not present

## 2017-07-24 ENCOUNTER — Ambulatory Visit (HOSPITAL_COMMUNITY)
Admission: RE | Admit: 2017-07-24 | Discharge: 2017-07-24 | Disposition: A | Discharge status: Home / Self Care | Payer: Medicare Other | Source: Ambulatory Visit | Attending: Internal Medicine | Admitting: Internal Medicine

## 2017-07-24 ENCOUNTER — Other Ambulatory Visit (HOSPITAL_COMMUNITY): Payer: Self-pay | Admitting: Internal Medicine

## 2017-07-24 DIAGNOSIS — R000 Tachycardia, unspecified: Secondary | ICD-10-CM | POA: Diagnosis not present

## 2017-07-24 DIAGNOSIS — Z0001 Encounter for general adult medical examination with abnormal findings: Secondary | ICD-10-CM | POA: Diagnosis not present

## 2017-07-24 DIAGNOSIS — R0602 Shortness of breath: Secondary | ICD-10-CM | POA: Diagnosis not present

## 2017-07-24 DIAGNOSIS — Z6824 Body mass index (BMI) 24.0-24.9, adult: Secondary | ICD-10-CM | POA: Diagnosis not present

## 2017-07-25 ENCOUNTER — Other Ambulatory Visit (HOSPITAL_COMMUNITY): Payer: Self-pay | Admitting: Internal Medicine

## 2017-07-25 DIAGNOSIS — M810 Age-related osteoporosis without current pathological fracture: Secondary | ICD-10-CM

## 2017-08-09 ENCOUNTER — Other Ambulatory Visit (HOSPITAL_COMMUNITY): Payer: Self-pay | Admitting: Respiratory Therapy

## 2017-08-09 DIAGNOSIS — R0602 Shortness of breath: Secondary | ICD-10-CM

## 2017-08-29 ENCOUNTER — Inpatient Hospital Stay (HOSPITAL_COMMUNITY): Admission: RE | Admit: 2017-08-29 | Payer: Self-pay | Source: Ambulatory Visit

## 2017-09-04 ENCOUNTER — Other Ambulatory Visit: Payer: Self-pay | Admitting: Obstetrics and Gynecology

## 2017-09-04 DIAGNOSIS — Z1231 Encounter for screening mammogram for malignant neoplasm of breast: Secondary | ICD-10-CM

## 2017-09-11 DIAGNOSIS — Z23 Encounter for immunization: Secondary | ICD-10-CM | POA: Diagnosis not present

## 2017-09-14 ENCOUNTER — Ambulatory Visit (HOSPITAL_COMMUNITY)
Admission: RE | Admit: 2017-09-14 | Discharge: 2017-09-14 | Disposition: A | Discharge status: Home / Self Care | Payer: Medicare Other | Source: Ambulatory Visit | Attending: Internal Medicine | Admitting: Internal Medicine

## 2017-09-14 DIAGNOSIS — R0602 Shortness of breath: Secondary | ICD-10-CM | POA: Diagnosis not present

## 2017-09-14 DIAGNOSIS — J449 Chronic obstructive pulmonary disease, unspecified: Secondary | ICD-10-CM | POA: Diagnosis not present

## 2017-09-16 LAB — PULMONARY FUNCTION TEST
DL/VA % pred: 84 %
DL/VA: 3.93 ml/min/mmHg/L
DLCO UNC: 15.52 ml/min/mmHg
DLCO cor % pred: 67 %
DLCO cor: 15.52 ml/min/mmHg
DLCO unc % pred: 67 %
FEF 25-75 Pre: 0.68 L/sec
FEF2575-%Pred-Pre: 41 %
FEV1-%PRED-PRE: 66 %
FEV1-PRE: 1.36 L
FEV1FVC-%Pred-Pre: 75 %
FEV6-%Pred-Pre: 92 %
FEV6-Pre: 2.4 L
FEV6FVC-%Pred-Pre: 104 %
FVC-%Pred-Pre: 88 %
FVC-Pre: 2.42 L
PRE FEV1/FVC RATIO: 56 %
Pre FEV6/FVC Ratio: 100 %
RV % PRED: 160 %
RV: 3.57 L
TLC % pred: 108 %
TLC: 5.29 L

## 2017-10-10 ENCOUNTER — Ambulatory Visit (HOSPITAL_COMMUNITY)
Admission: RE | Admit: 2017-10-10 | Discharge: 2017-10-10 | Disposition: A | Discharge status: Home / Self Care | Payer: Medicare Other | Source: Ambulatory Visit | Attending: Internal Medicine | Admitting: Internal Medicine

## 2017-10-10 DIAGNOSIS — M810 Age-related osteoporosis without current pathological fracture: Secondary | ICD-10-CM

## 2017-10-10 DIAGNOSIS — Z780 Asymptomatic menopausal state: Secondary | ICD-10-CM | POA: Diagnosis not present

## 2017-10-10 DIAGNOSIS — M8588 Other specified disorders of bone density and structure, other site: Secondary | ICD-10-CM | POA: Diagnosis not present

## 2017-10-10 DIAGNOSIS — M8589 Other specified disorders of bone density and structure, multiple sites: Secondary | ICD-10-CM | POA: Diagnosis not present

## 2017-10-12 ENCOUNTER — Ambulatory Visit: Payer: Medicare Other | Admitting: Internal Medicine

## 2017-10-12 ENCOUNTER — Encounter: Payer: Self-pay | Admitting: Internal Medicine

## 2017-10-12 ENCOUNTER — Other Ambulatory Visit (INDEPENDENT_AMBULATORY_CARE_PROVIDER_SITE_OTHER): Payer: Medicare Other

## 2017-10-12 VITALS — BP 102/62 | HR 100 | Ht 63.0 in | Wt 138.0 lb

## 2017-10-12 DIAGNOSIS — J45991 Cough variant asthma: Secondary | ICD-10-CM | POA: Insufficient documentation

## 2017-10-12 DIAGNOSIS — J4540 Moderate persistent asthma, uncomplicated: Secondary | ICD-10-CM

## 2017-10-12 LAB — CBC WITH DIFFERENTIAL/PLATELET
Basophils Absolute: 0.1 10*3/uL (ref 0.0–0.1)
Basophils Relative: 0.9 % (ref 0.0–3.0)
EOS PCT: 4.2 % (ref 0.0–5.0)
Eosinophils Absolute: 0.3 10*3/uL (ref 0.0–0.7)
HCT: 39.6 % (ref 36.0–46.0)
Hemoglobin: 13 g/dL (ref 12.0–15.0)
Lymphocytes Relative: 26.2 % (ref 12.0–46.0)
Lymphs Abs: 1.6 10*3/uL (ref 0.7–4.0)
MCHC: 32.9 g/dL (ref 30.0–36.0)
MCV: 94.7 fl (ref 78.0–100.0)
MONOS PCT: 11 % (ref 3.0–12.0)
Monocytes Absolute: 0.7 10*3/uL (ref 0.1–1.0)
NEUTROS ABS: 3.6 10*3/uL (ref 1.4–7.7)
NEUTROS PCT: 57.7 % (ref 43.0–77.0)
PLATELETS: 273 10*3/uL (ref 150.0–400.0)
RBC: 4.18 Mil/uL (ref 3.87–5.11)
RDW: 13.5 % (ref 11.5–15.5)
WBC: 6.3 10*3/uL (ref 4.0–10.5)

## 2017-10-12 LAB — POCT EXHALED NITRIC OXIDE
FeNO level (ppb): 39
Nitric Oxide: 39

## 2017-10-12 MED ORDER — PANTOPRAZOLE SODIUM 40 MG PO TBEC: 40.0000 mg | DELAYED_RELEASE_TABLET | Freq: Every day | ORAL | 2 refills | Status: DC

## 2017-10-12 MED ORDER — BUDESONIDE-FORMOTEROL FUMARATE 80-4.5 MCG/ACT IN AERO: 2.0000 | INHALATION_SPRAY | Freq: Two times a day (BID) | RESPIRATORY_TRACT | 11 refills | Status: DC

## 2017-10-12 MED ORDER — FAMOTIDINE 20 MG PO TABS: ORAL_TABLET | ORAL | 2 refills | Status: DC

## 2017-10-12 MED ORDER — BUDESONIDE-FORMOTEROL FUMARATE 80-4.5 MCG/ACT IN AERO: 2.0000 | INHALATION_SPRAY | Freq: Two times a day (BID) | RESPIRATORY_TRACT | 0 refills | Status: DC

## 2017-10-12 NOTE — Progress Notes (Signed)
nd      Patient ID: Mckenzie Collins, female   DOB: 28-Feb-1941,    MRN: [id number]  HPI  49 yowf never smoker asthma as child but 5th grade had  tonsilectomy  then no symptoms at all but around 2008 noted sob p exp to cat assoc with throat fullness, eyes  water itch completely goes away with avoidance then new doe x fall 2017 variable intensity but ? Better with cooler weather so referred to pulmonary clinic 10/12/2017 by Dr   Willey Blade with sob ? Asthma.  10/12/2017 1st [facility] office visit/ Mckenzie Collins   Chief Complaint  Patient presents with  . Pulmonary Consult    Referred by Dr. Willey Blade. Pt states having SOB for the past year, worse since Summer 2018. She gets winded sometimes when she walks quickly for a short distance or up stairs. She has occ cough that is non prod.   sev years prior to OV  Developed cp > dx as GERD and uses otc's prn heartburn but since then has intermittent sense of throat irritation/ urge to cough  attributes   to reflux but "not that bad" No noct symptoms  Doe on best days = MMRC1 = can walk nl pace, flat grade, can't hurry or go uphills or steps s sob  But some days can't get across the room s same sensation s assoc cp n or v or diaphoresis    No obvious day to day or daytime variability or assoc excess/ purulent sputum or mucus plugs or hemoptysis or cp or chest tightness, subjective wheeze or overt sinus  symptoms. No unusual exposure hx or h/o childhood pna or knowledge of premature birth.  Sleeping ok flat without nocturnal  or early am exacerbation  of respiratory  c/o's or need for noct saba. Also denies any obvious fluctuation of symptoms with weather or environmental changes or other aggravating or alleviating factors except as outlined above   Current Allergies, Complete Past Medical History, Past Surgical History, Family History, and Social History were reviewed in [employer] record.  ROS  The following are not active complaints  unless bolded Hoarseness, sore throat, dysphagia, dental problems, itching, sneezing,  nasal congestion or discharge of excess mucus or purulent secretions, ear ache,   fever, chills, sweats, unintended wt loss or wt gain, classically pleuritic or exertional cp,  orthopnea pnd or leg swelling, presyncope, palpitations, abdominal pain, anorexia, nausea, vomiting, diarrhea  or change in bowel habits or change in bladder habits, change in stools or change in urine, dysuria, hematuria,  rash, arthralgias, visual complaints, headache, numbness, weakness or ataxia or problems with walking or coordination,  change in mood/affect or memory.        Current Meds  Medication Sig  . aspirin 81 MG tablet Take 81 mg by mouth daily.    . calcium citrate-vitamin D (CITRACAL+D) 315-200 MG-UNIT per tablet Take 1 tablet by mouth daily.    . Cholecalciferol (VITAMIN D PO) Take 1 capsule by mouth daily.  . Multiple Vitamins-Minerals (CENTRUM SILVER ULTRA WOMENS PO) Take 1 tablet by mouth daily.                  Review of Systems     Objective:   Physical Exam    pleasant slt anxious amb wf ? Mild shiners/ freq throat clearing   Wt Readings from Last 3 Encounters:  10/12/17 138 lb (62.6 kg)  01/23/17 140 lb (63.5 kg)  01/03/17 141 lb (64  kg)      Vital signs reviewed - Note on arrival 02 sats  96% on RA     HEENT: nl dentition, turbinates bilaterally, and oropharynx which is pristine. Nl external ear canals without cough reflex   NECK :  without JVD/Nodes/TM/ nl carotid upstrokes bilaterally   LUNGS: no acc muscle use,  Nl contour chest which is clear to A and P bilaterally without cough on insp or exp maneuvers   CV:  RRR  no s3 or murmur or increase in P2, and no edema   ABD:  soft and nontender with nl inspiratory excursion in the supine position. No bruits or organomegaly appreciated, bowel sounds nl  MS:  Nl gait/ ext warm without deformities, calf tenderness, cyanosis or  clubbing No obvious joint restrictions   SKIN: warm and dry without lesions    NEURO:  alert, approp, nl sensorium with  no motor or cerebellar deficits apparent.         I personally reviewed images and agree with radiology impression as follows:  CXR:   07/24/17 slt hyperinflated/ no acute abnormality    Labs ordered 10/12/2017    Allergy profile   Assessment:

## 2017-10-12 NOTE — Patient Instructions (Addendum)
Pantoprazole (protonix) 40 mg   Take  30-60 min before first meal of the day and Pepcid (famotidine)  20 mg one @  bedtime until return to office - this is the best way to tell whether stomach acid is contributing to your problem.    GERD (REFLUX)  is an extremely common cause of respiratory symptoms just like yours , many times with no obvious heartburn at all.    It can be treated with medication, but also with lifestyle changes including elevation of the head of your bed (ideally with 6 inch  bed blocks),  Smoking cessation, avoidance of late meals, excessive alcohol, and avoid fatty foods, chocolate, peppermint, colas, red wine, and acidic juices such as orange juice.  NO MINT OR MENTHOL PRODUCTS SO NO COUGH DROPS   USE SUGARLESS CANDY INSTEAD (Jolley ranchers or Stover's or Life Savers) or even ice chips will also do - the key is to swallow to prevent all throat clearing. NO OIL BASED VITAMINS - use powdered substitutes.   If breathing bothering you ok to use up to 2 puffs of symbicort 80 every 12 hours as needed   Work on inhaler technique:  relax and gently blow all the way out then take a nice smooth deep breath back in, triggering the inhaler at same time you start breathing in.  Hold for up to 5 seconds if you can. Blow out thru nose. Rinse and gargle with water when done     Please remember to go to the lab department downstairs in the basement  for your tests - we will call you with the results when they are available.      Please schedule a follow up office visit in 6 weeks, call sooner if needed with pfts on return

## 2017-10-13 ENCOUNTER — Encounter: Payer: Self-pay | Admitting: Internal Medicine

## 2017-10-13 DIAGNOSIS — J4540 Moderate persistent asthma, uncomplicated: Secondary | ICD-10-CM | POA: Insufficient documentation

## 2017-10-13 NOTE — Assessment & Plan Note (Addendum)
PFT's  09/14/2017  FEV1 1.36 (66 % ) ratio 56    with DLCO  67/67c % corrects to 85 % for alv volume  And classic curvature  - Allergy profile 10/12/2017 >  Eos 0.3 /  IgE   - FENO 10/12/2017  =   39  - 10/12/2017  After extensive coaching HFA effectiveness =    75% from a baseline of nearly 0 > try symb 80 2bid   Not clear how much of her symptoms are  asthma vs uacs so will treat this first pending f/u full pfts   Note Based on the study from [place]  378; 20 p 1865 (2018) in pts with mild asthma it is reasonable to use low dose symbicort eg 80 2bid "prn" flare in this setting but I emphasized this was only shown with symbicort and takes advantage of the rapid onset of action but is not the same as "rescue therapy" but can be stopped once the acute symptoms have resolved and the need for rescue has been minimized (< 2 x weekly)     Total time devoted to counseling  > 50 % of initial 60 min office visit:  review case with pt/ discussion of options/alternatives/ personally creating written customized instructions  in presence of pt  then going over those specific  Instructions directly with the pt including how to use all of the meds but in particular covering each new medication in detail and the difference between the maintenance= "automatic" meds and the prns using an action plan format for the latter (If this problem/symptom => do that organization reading Left to right).  Please see AVS from this visit for a full list of these instructions which I personally wrote for this pt and  are unique to this visit.

## 2017-10-13 NOTE — Assessment & Plan Note (Signed)
See moderate persistent asthma sep a/p   Upper airway cough syndrome (previously labeled PNDS),  is so named because it's frequently impossible to sort out how much is  CR/sinusitis with freq throat clearing (which can be related to primary GERD)   vs  causing  secondary (" extra esophageal")  GERD from wide swings in gastric pressure that occur with throat clearing, often  promoting self use of mint and menthol lozenges that reduce the lower esophageal sphincter tone and exacerbate the problem further in a cyclical fashion.   These are the same pts (now being labeled as having "irritable larynx syndrome" by some cough centers) who not infrequently have a history of having failed to tolerate ace inhibitors,  dry powder inhalers or biphosphonates or report having atypical/extraesophageal reflux symptoms that don't respond to standard doses of PPI  and are easily confused as having aecopd or asthma flares by even experienced allergists/ pulmonologists (myself included).   Will try max rx for GERD as maint rx and just rx asthma prn (see sep a/p) until sort out what part is uacs as note she's already developed non-cardiac cp/ chronic dry daytime cough and variable sob  Which can all just be manifestations of a "reflex to reflux" at this point

## 2017-10-15 ENCOUNTER — Ambulatory Visit
Admission: RE | Admit: 2017-10-15 | Discharge: 2017-10-15 | Disposition: A | Discharge status: Home / Self Care | Payer: Medicare Other | Source: Ambulatory Visit | Attending: Obstetrics and Gynecology | Admitting: Obstetrics and Gynecology

## 2017-10-15 DIAGNOSIS — Z1231 Encounter for screening mammogram for malignant neoplasm of breast: Secondary | ICD-10-CM | POA: Diagnosis not present

## 2017-10-15 LAB — RESPIRATORY ALLERGY PROFILE REGION II ~~LOC~~
Allergen, Cedar tree, t12: <0.1 kU/L
Allergen, D pternoyssinus,d7: 15.4 kU/L — ABNORMAL HIGH
Allergen, Mulberry, t76: <0.1 kU/L
Aspergillus fumigatus, m3: <0.1 kU/L
Box Elder IgE: <0.1 kU/L
CLADOSPORIUM HERBARUM (M2) IGE: <0.1 kU/L
CLASS: 0
CLASS: 0
CLASS: 0
CLASS: 0
CLASS: 0
CLASS: 0
CLASS: 4
CLASS: 4
COMMON RAGWEED (SHORT) (W1) IGE: 0.25 kU/L — ABNORMAL HIGH
Cat Dander: 36.7 kU/L — ABNORMAL HIGH
Class: 0
Class: 0
Class: 0
Class: 0
Class: 0
Class: 0
Class: 0
Class: 0
Class: 0
Class: 0
Class: 0
Class: 0
Class: 0
Class: 0
Class: 2
Class: 3
D. farinae: 21.7 kU/L — ABNORMAL HIGH
Dog Dander: 2.79 kU/L — ABNORMAL HIGH
Elm IgE: <0.1 kU/L
IgE (Immunoglobulin E), Serum: 223 kU/L — ABNORMAL HIGH (ref <=114)
Johnson Grass: 0.11 kU/L — ABNORMAL HIGH
Pecan/Hickory Tree IgE: <0.1 kU/L
TIMOTHY GRASS: 0.23 kU/L — AB

## 2017-10-15 LAB — INTERPRETATION:

## 2017-10-16 ENCOUNTER — Telehealth: Payer: Self-pay | Admitting: *Deleted

## 2017-10-16 NOTE — Telephone Encounter (Signed)
Informed patient mammogram was normal. Verbalized understanding.

## 2017-10-16 NOTE — Progress Notes (Signed)
Spoke with pt and notified of results per Dr. Wert. Pt verbalized understanding and denied any questions. 

## 2017-11-23 ENCOUNTER — Ambulatory Visit: Payer: Self-pay | Admitting: Internal Medicine

## 2017-11-26 ENCOUNTER — Ambulatory Visit: Payer: Medicare Other | Admitting: Internal Medicine

## 2017-11-26 ENCOUNTER — Encounter: Payer: Self-pay | Admitting: Internal Medicine

## 2017-11-26 VITALS — BP 110/66 | HR 82 | Ht 63.0 in | Wt 139.2 lb

## 2017-11-26 DIAGNOSIS — J45991 Cough variant asthma: Secondary | ICD-10-CM

## 2017-11-26 DIAGNOSIS — J4540 Moderate persistent asthma, uncomplicated: Secondary | ICD-10-CM

## 2017-11-26 MED ORDER — BUDESONIDE-FORMOTEROL FUMARATE 80-4.5 MCG/ACT IN AERO: 2.0000 | INHALATION_SPRAY | Freq: Two times a day (BID) | RESPIRATORY_TRACT | 11 refills | Status: DC

## 2017-11-26 NOTE — Patient Instructions (Addendum)
Work on inhaler technique:  relax and gently blow all the way out then take a nice smooth deep breath back in, triggering the inhaler at same time you start breathing in.  Hold for up to 5 seconds if you can. Blow out thru nose. Rinse and gargle with water when done    Symbicort one every day and the second  puff is optional during exercise if needed for breathing    Ok to stop the acid suppression   Please schedule a follow up office visit in 6 weeks, call sooner if needed with all medications /inhalers/ solutions in hand so we can verify exactly what you are taking. This includes all medications from all doctors and over the counters  Late add ? Trial of singulair next

## 2017-11-26 NOTE — Progress Notes (Signed)
nd      Patient ID: Mckenzie Collins, female   DOB: 1941/02/27,    MRN: [id number]    Brief patient profile:  39 yowf never smoker asthma as child but 5th grade had  tonsilectomy  then no symptoms at all but around 2008 noted sob p exp to cat assoc with throat fullness, eyes  water itch completely goes away with avoidance then new doe x fall 2017 variable intensity but ? Better with cooler weather so referred to pulmonary clinic 10/12/2017 by Dr   Willey Blade with sob ? Asthma.    History of Present Illness  10/12/2017 1st [facility] office visit/ Wert   Chief Complaint  Patient presents with  . Pulmonary Consult    Referred by Dr. Willey Blade. Pt states having SOB for the past year, worse since Summer 2018. She gets winded sometimes when she walks quickly for a short distance or up stairs. She has occ cough that is non prod.   sev years prior to OV  Developed cp > dx as GERD and uses otc's prn heartburn but since then has intermittent sense of throat irritation/ urge to cough  attributes   to reflux but "not that bad" No noct symptoms  Doe on best days = MMRC1 = can walk nl pace, flat grade, can't hurry or go uphills or steps s sob  But some days can't get across the room s same sensation s assoc cp n or v or diaphoresis  rec Pantoprazole (protonix) 40 mg   Take  30-60 min before first meal of the day and Pepcid (famotidine)  20 mg one @  bedtime until return to office - this is the best way to tell whether stomach acid is contributing to your problem.   GERD diet  If breathing bothering you ok to use up to 2 puffs of symbicort 80 every 12 hours as needed Work on inhaler technique:  relax and gently blow all the way out then take a nice smooth deep breath back in, triggering the inhaler at same time you start breathing in.  Hold for up to 5 seconds if you can. Blow out thru nose. Rinse and gargle with water when done Please remember to go to the lab department downstairs in the basement  for your  tests - we will call you with the results when they are available.  Please schedule a follow up office visit in 6 weeks, call sooner if needed with pfts on return     11/26/2017  f/u ov/Wert re:  Probable asthma  Chief Complaint  Patient presents with  . Follow-up    Pt states it is hard to tell if her breathing has improved. She is using her Symbicort 2 x per wk on average.   no noct symptoms at all  About 3/7 days using symb after the first 15 min of her walk and seem better for the next 30 min portion  No cough or sense of globus now  but convinced the gerd rx did nothing for her and concerned about potential side effects   No obvious day to day or daytime variability or assoc excess/ purulent sputum or mucus plugs or hemoptysis or cp or chest tightness, subjective wheeze or overt sinus or hb symptoms. No unusual exposure hx or h/o childhood pna/ asthma or knowledge of premature birth.  Sleeping ok flat without nocturnal  or early am exacerbation  of respiratory  c/o's or need for noct saba. Also denies any  obvious fluctuation of symptoms with weather or environmental changes or other aggravating or alleviating factors except as outlined above   Current Allergies, Complete Past Medical History, Past Surgical History, Family History, and Social History were reviewed in [employer] record.  ROS  The following are not active complaints unless bolded Hoarseness, sore throat, dysphagia, dental problems, itching, sneezing,  nasal congestion or discharge of excess mucus or purulent secretions, ear ache,   fever, chills, sweats, unintended wt loss or wt gain, classically pleuritic or exertional cp,  orthopnea pnd or leg swelling, presyncope, palpitations, abdominal pain, anorexia, nausea, vomiting, diarrhea  or change in bowel habits or change in bladder habits, change in stools or change in urine, dysuria, hematuria,  rash, arthralgias, visual complaints, headache, numbness,  weakness or ataxia or problems with walking or coordination,  change in mood/affect or memory.        Current Meds  Medication Sig  . aspirin 81 MG tablet Take 81 mg by mouth daily.    . budesonide-formoterol (SYMBICORT) 80-4.5 MCG/ACT inhaler Inhale 2 puffs into the lungs 2 (two) times daily.  . calcium citrate-vitamin D (CITRACAL+D) 315-200 MG-UNIT per tablet Take 1 tablet by mouth daily.    . Cholecalciferol (VITAMIN D PO) Take 1 capsule by mouth daily.  . Multiple Vitamins-Minerals (CENTRUM SILVER ULTRA WOMENS PO) Take 1 tablet by mouth daily.    . [DISCONTINUED] budesonide-formoterol (SYMBICORT) 80-4.5 MCG/ACT inhaler Inhale 2 puffs into the lungs 2 (two) times daily.  .   famotidine (PEPCID) 20 MG tablet One at bedtime  . [  pantoprazole (PROTONIX) 40 MG tablet Take 1 tablet (40 mg total) by mouth daily. Take 30-60 min before first meal of the day                 Objective:   Physical Exam done    amb wf nad    11/26/2017        139  10/12/17 138 lb (62.6 kg)  01/23/17 140 lb (63.5 kg)  01/03/17 141 lb (64 kg)      Vital signs reviewed - Note on arrival 02 sats  99% on RA     HEENT: nl dentition, turbinates bilaterally, and oropharynx. Nl external ear canals without cough reflex   NECK :  without JVD/Nodes/TM/ nl carotid upstrokes bilaterally   LUNGS: no acc muscle use,  Nl contour chest which is clear to A and P bilaterally without cough on insp or exp maneuvers   CV:  RRR  no s3 or murmur or increase in P2, and no edema   ABD:  soft and nontender with nl inspiratory excursion in the supine position. No bruits or organomegaly appreciated, bowel sounds nl  MS:  Nl gait/ ext warm without deformities, calf tenderness, cyanosis or clubbing No obvious joint restrictions   SKIN: warm and dry without lesions    NEURO:  alert, approp, nl sensorium with  no motor or cerebellar deficits apparent.            Assessment:

## 2017-11-27 ENCOUNTER — Encounter: Payer: Self-pay | Admitting: Internal Medicine

## 2017-11-27 NOTE — Assessment & Plan Note (Signed)
Not really coughing now and doesn't think the gerd rx helped symptoms so ok to try off   Cautioned:  NB the  ramp to expected improvement in symptoms from an empiric trial of PPI (and for that matter, worsening, if a chronic effective medication is stopped)  can be measured in weeks, not days, a common misconception because this is not the same as treating heartburn (no immediate cause and effect relationship)  so that response to therapy or lack thereof can be very difficult to assess especially if the patient is not adherent to the treatment plan which includes dietary restrictions.

## 2017-11-27 NOTE — Assessment & Plan Note (Addendum)
PFT's  09/14/2017  FEV1 1.36 (66 % ) ratio 56    with DLCO  67/67c % corrects to 85 % for alv volume  And classic curvature  - Allergy profile 10/12/2017 >  Eos 0.3 /  IgE  223 with RAST pos dust, cat> dog, grass, ragweed - FENO 10/12/2017  =   39  - 10/12/2017    try symb 80 2bid - 11/26/2017  After extensive coaching inhaler device  effectiveness =    75% from baseline 25%  So try symbicort 80  1 each am other doses prn as pt very anxious re side effects    Pt is very focuses on side effects vs benefits and convinced the symbicort does improve her ex tol when she uses it so advised to start using as above first in subtherapeutic doses and see what if any side effects (which would be likely dose dependent) will develop.  The other option is add on trial of singulair to low dose symbicort as she does have atopic component per above testing.   I had an extended discussion with the patient reviewing all relevant studies completed to date and  lasting 15 to 20 minutes of a 25 minute visit    Each maintenance medication was reviewed in detail including most importantly the difference between maintenance and prns and under what circumstances the prns are to be triggered using an action plan format that is not reflected in the computer generated alphabetically organized AVS.    Please see AVS for specific instructions unique to this visit that I personally wrote and verbalized to the the pt in detail and then reviewed with pt  by my nurse highlighting any  changes in therapy recommended at today's visit to their plan of care.

## 2018-01-08 ENCOUNTER — Encounter: Payer: Self-pay | Admitting: Internal Medicine

## 2018-01-08 ENCOUNTER — Ambulatory Visit (INDEPENDENT_AMBULATORY_CARE_PROVIDER_SITE_OTHER)
Admission: RE | Admit: 2018-01-08 | Discharge: 2018-01-08 | Disposition: A | Discharge status: Home / Self Care | Payer: Medicare Other | Source: Ambulatory Visit | Attending: Internal Medicine | Admitting: Internal Medicine

## 2018-01-08 ENCOUNTER — Other Ambulatory Visit (INDEPENDENT_AMBULATORY_CARE_PROVIDER_SITE_OTHER): Payer: Medicare Other

## 2018-01-08 ENCOUNTER — Telehealth: Payer: Self-pay | Admitting: Internal Medicine

## 2018-01-08 ENCOUNTER — Ambulatory Visit: Payer: Medicare Other | Admitting: Internal Medicine

## 2018-01-08 DIAGNOSIS — R0609 Other forms of dyspnea: Secondary | ICD-10-CM

## 2018-01-08 DIAGNOSIS — J4540 Moderate persistent asthma, uncomplicated: Secondary | ICD-10-CM

## 2018-01-08 DIAGNOSIS — R079 Chest pain, unspecified: Secondary | ICD-10-CM

## 2018-01-08 DIAGNOSIS — R0602 Shortness of breath: Secondary | ICD-10-CM | POA: Diagnosis not present

## 2018-01-08 LAB — BASIC METABOLIC PANEL
BUN: 23 mg/dL (ref 6–23)
CALCIUM: 10.1 mg/dL (ref 8.4–10.5)
CO2: 27 mEq/L (ref 19–32)
CREATININE: 0.85 mg/dL (ref 0.40–1.20)
Chloride: 105 mEq/L (ref 96–112)
GFR: 69.46 mL/min (ref >60.00)
Glucose, Bld: 95 mg/dL (ref 70–99)
Potassium: 4.5 mEq/L (ref 3.5–5.1)
SODIUM: 140 meq/L (ref 135–145)

## 2018-01-08 LAB — CBC WITH DIFFERENTIAL/PLATELET
BASOS PCT: 0.7 % (ref 0.0–3.0)
Basophils Absolute: 0 10*3/uL (ref 0.0–0.1)
EOS ABS: 0.1 10*3/uL (ref 0.0–0.7)
Eosinophils Relative: 2.1 % (ref 0.0–5.0)
HCT: 39.5 % (ref 36.0–46.0)
Hemoglobin: 13.5 g/dL (ref 12.0–15.0)
Lymphocytes Relative: 20.5 % (ref 12.0–46.0)
Lymphs Abs: 1.3 10*3/uL (ref 0.7–4.0)
MCHC: 34.1 g/dL (ref 30.0–36.0)
MCV: 92.7 fl (ref 78.0–100.0)
MONO ABS: 0.5 10*3/uL (ref 0.1–1.0)
Monocytes Relative: 8.5 % (ref 3.0–12.0)
NEUTROS ABS: 4.4 10*3/uL (ref 1.4–7.7)
Neutrophils Relative %: 68.2 % (ref 43.0–77.0)
PLATELETS: 265 10*3/uL (ref 150.0–400.0)
RBC: 4.26 Mil/uL (ref 3.87–5.11)
RDW: 12.9 % (ref 11.5–15.5)
WBC: 6.4 10*3/uL (ref 4.0–10.5)

## 2018-01-08 LAB — BRAIN NATRIURETIC PEPTIDE: PRO B NATRI PEPTIDE: 49 pg/mL (ref 0.0–100.0)

## 2018-01-08 LAB — TROPONIN I: TNIDX: 0 ug/L (ref 0.00–0.06)

## 2018-01-08 LAB — NITRIC OXIDE: NITRIC OXIDE: 32

## 2018-01-08 LAB — TSH: TSH: 2.17 u[IU]/mL (ref 0.35–4.50)

## 2018-01-08 MED ORDER — PANTOPRAZOLE SODIUM 40 MG PO TBEC: 40.0000 mg | DELAYED_RELEASE_TABLET | Freq: Every day | ORAL | 2 refills | Status: DC

## 2018-01-08 MED ORDER — BUDESONIDE-FORMOTEROL FUMARATE 80-4.5 MCG/ACT IN AERO: 2.0000 | INHALATION_SPRAY | Freq: Two times a day (BID) | RESPIRATORY_TRACT | 11 refills | Status: AC

## 2018-01-08 MED ORDER — CALCIUM CITRATE-VITAMIN D 315-200 MG-UNIT PO TABS: 1.0000 | ORAL_TABLET | Freq: Every day | ORAL | Status: DC

## 2018-01-08 MED ORDER — FAMOTIDINE 20 MG PO TABS: ORAL_TABLET | ORAL | 2 refills | Status: DC

## 2018-01-08 MED ORDER — MOMETASONE FURO-FORMOTEROL FUM 100-5 MCG/ACT IN AERO: INHALATION_SPRAY | RESPIRATORY_TRACT | 11 refills | Status: DC

## 2018-01-08 NOTE — Patient Instructions (Addendum)
Resume Pantoprazole (protonix) 40 mg   Take  30-60 min before first meal of the day and Pepcid (famotidine)  20 mg one @  bedtime until return to office - this is the best way to tell whether stomach acid is contributing to your problem.    GERD (REFLUX)  is an extremely common cause of respiratory symptoms just like yours , many times with no obvious heartburn at all.    It can be treated with medication, but also with lifestyle changes including elevation of the head of your bed (ideally with 6 inch  bed blocks),  Smoking cessation, avoidance of late meals, excessive alcohol, and avoid fatty foods, chocolate, peppermint, colas, red wine, and acidic juices such as orange juice.  NO MINT OR MENTHOL PRODUCTS SO NO COUGH DROPS   USE SUGARLESS CANDY INSTEAD (Jolley ranchers or Stover's or Life Savers) or even ice chips will also do - the key is to swallow to prevent all throat clearing. NO OIL BASED VITAMINS - use powdered substitutes.  Increase symbicort 80 (or dulera 100 ) to Take 2 puffs first thing in am and then another 2 puffs about 12 hours later.   Work on inhaler technique:  relax and gently blow all the way out then take a nice smooth deep breath back in, triggering the inhaler at same time you start breathing in.  Hold for up to 5 seconds if you can. Blow out thru nose. Rinse and gargle with water when done      Please remember to go to the lab and x-ray department downstairs in the basement  for your tests - we will call you with the results when they are available.     Please schedule a follow up office visit in 6 weeks, call sooner if needed with all medications /inhalers/ solutions in hand so we can verify exactly what you are taking. This includes all medications from all doctors and over the counters

## 2018-01-08 NOTE — Telephone Encounter (Signed)
Notes recorded by Elton Sin, LPN on 02/06/6146 at 0:92 PM EST [facility] 01/08/18 ------  Notes recorded by Tanda Rockers, MD on 01/08/2018 at 1:34 PM EST Call patient : Studies are unremarkable, no change in recs  Spoke with patient. She is aware of results. Verbalized understanding.   Nothing else needed at time of call.

## 2018-01-08 NOTE — Progress Notes (Signed)
nd      Patient ID: Mckenzie Collins, female   DOB: 1941-10-28,    MRN: [id number]    Brief patient profile:  51 yowf never smoker asthma as child but 5th grade had  tonsilectomy  then no symptoms at all but around 2008 noted sob p exp to cat assoc with throat fullness, eyes  water itch completely goes away with avoidance then new doe x fall 2017 variable intensity but ? Better with cooler weather so referred to pulmonary clinic 10/12/2017 by Dr   Mckenzie Collins with sob ? Asthma.    History of Present Illness  10/12/2017 1st [facility] office visit/ Mckenzie Collins   Chief Complaint  Patient presents with  . Pulmonary Consult    Referred by Dr. Willey Collins. Pt states having SOB for the past year, worse since Summer 2018. She gets winded sometimes when she walks quickly for a short distance or up stairs. She has occ cough that is non prod.   sev years prior to OV  Developed cp > dx as GERD and uses otc's prn heartburn but since then has intermittent sense of throat irritation/ urge to cough  attributes   to reflux but "not that bad" No noct symptoms  Doe on best days = MMRC1 = can walk nl pace, flat grade, can't hurry or go uphills or steps s sob  But some days can't get across the room s same sensation s assoc cp n or v or diaphoresis  rec Pantoprazole (protonix) 40 mg   Take  30-60 min before first meal of the day and Pepcid (famotidine)  20 mg one @  bedtime until return to office - this is the best way to tell whether stomach acid is contributing to your problem.   GERD diet  If breathing bothering you ok to use up to 2 puffs of symbicort 80 every 12 hours as needed Work on inhaler technique:   Please remember to go to the lab department downstairs in the basement  for your tests - we will call you with the results when they are available. Please schedule a follow up office visit in 6 weeks, call sooner if needed with pfts on return     11/26/2017  f/u ov/Mckenzie Collins re:  Probable asthma  Chief Complaint   Patient presents with  . Follow-up    Pt states it is hard to tell if her breathing has improved. She is using her Symbicort 2 x per wk on average.   no noct symptoms at all  About 3/7 days using symb after the first 15 min of her walk and seem better for the next 30 min portion  No cough or sense of globus now  but convinced the gerd rx did nothing for her and concerned about potential side effects rec Work on inhaler technique:  relax and gently blow all the way out then take a nice smooth deep breath back in, triggering the inhaler at same time you start breathing in.  Hold for up to 5 seconds if you can. Blow out thru nose. Rinse and gargle with water when done Symbicort one every day and the second  puff is optional during exercise if needed for breathing  Ok to stop the acid suppression     01/08/2018  F/u extended  ov/Mckenzie Collins re: "new onset" chest pressure (chart review indicates first w/u by cards in 2011) Chief Complaint  Patient presents with  . Follow-up    Breathing is unchanged. She states  that she has been having some chest and abd pain off and on for the past 3 days- "feel very full" worse after she eats.   new chest pressure 6 months comes and goes and aware of it  24/7 when thinks about it  and feels worse with eating anything for an hour/ worse since stopped gerd rx/ generalized anterior cp / epigastric discomfort  s rad or assoc nausea/ diaph Dyspnea:  Improved but not exercising - one time went to top of steps and sob still same but no cp worsening  Cough: none  Sleep: disturbed by chest discomfort which did not respond to tums on the noct prior to OV  and lasted for sev hours  SABA use:  None    No obvious other patterns in  day to day or daytime variability or assoc excess/ purulent sputum or mucus plugs or hemoptysis or   subjective wheeze or overt sinus or hb symptoms. No unusual exposure hx or h/o childhood pna/ asthma or knowledge of premature birth.  Sleeping ok  flat without nocturnal  or early am exacerbation  of respiratory  c/o's or need for noct saba. Also denies any obvious fluctuation of symptoms with weather or environmental changes or other aggravating or alleviating factors except as outlined above   Current Allergies, Complete Past Medical History, Past Surgical History, Family History, and Social History were reviewed in [employer] record.   ROS  The following are not active complaints unless bolded Hoarseness, sore throat, dysphagia, dental problems, itching, sneezing,  nasal congestion or discharge of excess mucus or purulent secretions, ear ache,   fever, chills, sweats, unintended wt loss or wt gain, classically pleuritic or exertional cp,  orthopnea pnd or leg swelling, presyncope, palpitations, abdominal pain, anorexia, nausea, vomiting, diarrhea  or change in bowel habits or change in bladder habits, change in stools or change in urine, dysuria, hematuria,  rash, arthralgias, visual complaints, headache, numbness, weakness or ataxia or problems with walking or coordination,  change in mood/affect or memory.        Current Meds  Medication Sig  . budesonide-formoterol (SYMBICORT) 80-4.5 MCG/ACT inhaler Inhale 2 puffs into the lungs 2 (two) times daily.  . calcium citrate-vitamin D (CITRACAL+D) 315-200 MG-UNIT tablet Take 1 tablet by mouth daily.  . Cholecalciferol (VITAMIN D PO) Take 1 capsule by mouth daily.  . Multiple Vitamins-Minerals (CENTRUM SILVER ULTRA WOMENS PO) Take 1 tablet by mouth daily.    .     .                  Objective:   Physical Exam      amb wf nad who failed to answer a single question asked in a straightforward manner, tending to go off on tangents or answer questions with ambiguous medical terms or diagnoses and seemed perplexed   when asked the same question more than once for clarification.    01/08/2018        138  11/26/2017        139  10/12/17 138 lb (62.6 kg)  01/23/17  140 lb (63.5 kg)  01/03/17 141 lb (64 kg)      HEENT: nl dentition, turbinates bilaterally, and oropharynx. Nl external ear canals without cough reflex   NECK :  without JVD/Nodes/TM/ nl carotid upstrokes bilaterally   LUNGS: no acc muscle use,  Nl contour chest which is clear to A and P bilaterally without cough on insp or exp maneuvers  CV:  RRR  no s3 or murmur or increase in P2, and no edema   ABD:  soft and nontender with nl inspiratory excursion in the supine position. No bruits or organomegaly appreciated, bowel sounds nl  MS:  Nl gait/ ext warm without deformities, calf tenderness, cyanosis or clubbing No obvious joint restrictions   SKIN: warm and dry without lesions    NEURO:  alert, approp, nl sensorium with  no motor or cerebellar deficits apparent.      ekg 01/08/2018  p walking nsr wnl / mild LA abn  Labs ordered/ reviewed:      Chemistry      Component Value Date/Time   NA 140 01/08/2018 1037   K 4.5 01/08/2018 1037   CL 105 01/08/2018 1037   CO2 27 01/08/2018 1037   BUN 23 01/08/2018 1037   CREATININE 0.85 01/08/2018 1037      Component Value Date/Time   CALCIUM 10.1 01/08/2018 1037        Lab Results  Component Value Date   WBC 6.4 01/08/2018   HGB 13.5 01/08/2018   HCT 39.5 01/08/2018   MCV 92.7 01/08/2018   PLT 265.0 01/08/2018       Lab Results  Component Value Date   TSH 2.17 01/08/2018     Lab Results  Component Value Date   PROBNP 49.0 01/08/2018       Trop  I                     0.0      01/08/2018          CXR PA and Lateral:   01/08/2018 :    I personally reviewed images and agree with radiology impression as follows:    No active cardiopulmonary disease.     Assessment:

## 2018-01-09 ENCOUNTER — Encounter: Payer: Self-pay | Admitting: Internal Medicine

## 2018-01-09 NOTE — Assessment & Plan Note (Signed)
Negative stress nuclear study and normal echocardiogram in 2005 and cards eval 2011 ([facility])  Not made worse with ex and had severe rest pain w/in 12 h of neg troponin and really no change in pattern x years (though she says this is the worse episode) and occurred off gerd rx so first rec restart gerd rx and f/u cards prn   I spent extra time with pt going over the ddx and why "heartburn" was labeled incorrectly as a heart problem from the start of it description (ie the namer was confused but it's a confusing area of medicine)  But potentially can certainly be confused with heart pain and gave her the usual tips to look for to tell them apart.

## 2018-01-09 NOTE — Assessment & Plan Note (Addendum)
PFT's  09/14/2017  FEV1 1.36 (66 % ) ratio 56    with DLCO  67/67c % corrects to 85 % for alv volume  And classic curvature  - Allergy profile 10/12/2017 >  Eos 0.3 /  IgE  223 with RAST pos dust, cat> dog, grass, ragweed - FENO 10/12/2017  =   39  - 10/12/2017    try symb 80 2bid - 11/26/2017   try symbicort 80  1 each am other doses prn as pt very anxious re side effects  - Spirometry 01/08/2018  FEV1 1.46 (68%)  Ratio 57 p am symbx1 though hfa very poor  - FENO 01/08/2018  =   32 on symb 80 one bid    - The proper method of use, as well as anticipated side effects, of a metered-dose inhaler are discussed and demonstrated to the patient. Improved effectiveness after extensive coaching during this visit to a level of approximately 75 % from a baseline of 50 %      DDX of  difficult airways management almost all start with A and  include Adherence, Ace Inhibitors, Acid Reflux, Active Sinus Disease, Alpha 1 Antitripsin deficiency, Anxiety masquerading as Airways dz,  ABPA,  Allergy(esp in young), Aspiration (esp in elderly), Adverse effects of meds,  Active smokers, A bunch of PE's (a small clot burden can't cause this syndrome unless there is already severe underlying pulm or vascular dz with poor reserve) plus two Bs  = Bronchiectasis and Beta blocker use..and one C= CHF  Adherence is always the initial "prime suspect" and is a multilayered concern that requires a "trust but verify" approach in every patient - starting with knowing how to use medications, especially inhalers, correctly, keeping up with refills and understanding the fundamental difference between maintenance and prns vs those medications only taken for a very short course and then stopped and not refilled.  - return with all meds in hand using a trust but verify approach to confirm accurate Medication  Reconciliation The principal here is that until we are certain that the  patients are doing what we've asked, it makes no sense to ask them  to do more.  - see hfa teaching   ? Acid (or non-acid) GERD > always difficult to exclude as up to 75% of pts in some series report no assoc GI/ Heartburn symptoms> rec max (24h)  acid suppression and diet restrictions/ reviewed and instructions given in writing.   ? Allergy > profile reviewed, try double dose of ics and ? Add singulair  And?  refer to allergy next   ? Anxiety > usually at the bottom of this list of usual suspects but should be much higher on this pt's based on H and P and may interfere with adherence issues   ? Chf/ cardiac asthma > doubt with bnp so low    I had an extended discussion with the patient reviewing all relevant studies completed to date and  lasting 25 minutes of a 40  minute office visit addressing refractory  severe non-specific but potentially very serious  respiratory symptoms of uncertain and potentially multiple  etiologies.   Each maintenance medication was reviewed in detail including most importantly the difference between maintenance and prns and under what circumstances the prns are to be triggered using an action plan format that is not reflected in the computer generated alphabetically organized AVS.    Please see AVS for specific instructions unique to this office visit that I personally wrote  and verbalized to the the pt in detail and then reviewed with pt  by my nurse highlighting any changes in therapy/plan of care  recommended at today's visit.

## 2018-01-09 NOTE — Assessment & Plan Note (Signed)
No evidence of anemia/ thyroid dz > see asthma

## 2018-01-09 NOTE — Progress Notes (Signed)
LMTCB on preferred phone number listed for patient. 

## 2018-01-10 ENCOUNTER — Telehealth: Payer: Self-pay | Admitting: Internal Medicine

## 2018-01-10 NOTE — Telephone Encounter (Signed)
Patient is aware of results nothing further needed.  

## 2018-02-11 ENCOUNTER — Other Ambulatory Visit: Payer: Self-pay | Admitting: Obstetrics and Gynecology

## 2018-02-18 ENCOUNTER — Other Ambulatory Visit: Payer: Self-pay | Admitting: Cardiovascular Disease

## 2018-02-19 ENCOUNTER — Ambulatory Visit: Payer: Self-pay | Admitting: Internal Medicine

## 2018-02-27 ENCOUNTER — Ambulatory Visit: Payer: Medicare Other | Admitting: Obstetrics and Gynecology

## 2018-02-27 ENCOUNTER — Encounter: Payer: Self-pay | Admitting: Obstetrics and Gynecology

## 2018-02-27 ENCOUNTER — Other Ambulatory Visit: Payer: Self-pay

## 2018-02-27 VITALS — BP 126/62 | HR 88 | Ht 64.0 in | Wt 140.0 lb

## 2018-02-27 DIAGNOSIS — Z01419 Encounter for gynecological examination (general) (routine) without abnormal findings: Secondary | ICD-10-CM

## 2018-02-27 NOTE — Progress Notes (Signed)
Patient ID: Mckenzie Collins, female   DOB: [birth year], 77 y.o.   MRN: [id number]  Assessment:  1. Annual Gyn Exam well woman with gyn. No pap sent. Plan:  1. pap smear done 2. return every 3 years or prn 3. Annual mammogram advised after age 16 Subjective:  Mckenzie Collins is a 77 y.o. female G2P2002 who presents for annual exam. No LMP recorded. Patient is postmenopausal. The patient has no complaints today. She is doing well overall. 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   The following portions of the patient's history were reviewed and updated as appropriate: allergies, current medications, past family history, past medical history, past social history, past surgical history and problem list. Past Medical History:  Diagnosis Date  . Asthma    In childhood  . Breast mass left   skin lesion around 11-12 x 2 -3 weeks  . Chest pain   . Dyspnea 2005   Negative stress nuclear in 2005; normal echocardiogram  . Gastroesophageal reflux disease    hiatal hernia; gastritis; dysphagia  . Hemorrhoids   . Inverted nipple left   always  . Low back pain   . Microscopic hematuria   . Osteoporosis   . Palpitations 2003   few short runs of supraventricular tachycardia on Holter    Past Surgical History:  Procedure Laterality Date  . COLONOSCOPY  2011  . TONSILLECTOMY       Current Outpatient Medications:  .  budesonide-formoterol (SYMBICORT) 80-4.5 MCG/ACT inhaler, Inhale 2 puffs into the lungs 2 (two) times daily., Disp: 1 Inhaler, Rfl: 11 .  calcium citrate-vitamin D (CITRACAL+D) 315-200 MG-UNIT tablet, Take 1 tablet by mouth daily., Disp: , Rfl:  .  Cholecalciferol (VITAMIN D PO), Take 1 capsule by mouth daily., Disp: , Rfl:  .  famotidine (PEPCID) 20 MG tablet, One at bedtime, Disp: 30 tablet, Rfl: 2 .  Multiple Vitamins-Minerals (CENTRUM SILVER ULTRA WOMENS PO), Take 1 tablet by mouth daily.  , Disp: , Rfl:  .  pantoprazole (PROTONIX) 40 MG tablet,  Take 1 tablet (40 mg total) by mouth daily. Take 30-60 min before first meal of the day, Disp: 30 tablet, Rfl: 2  Review of Systems Constitutional: negative Gastrointestinal: negative Genitourinary: negative  Objective:  BP 126/62 (BP Location: Right Arm, Patient Position: Sitting, Cuff Size: Normal)   Pulse 88   Ht 5\' 4"  (1.626 m)   Wt 140 lb (63.5 kg)   BMI 24.03 kg/m    BMI: Body mass index is 24.03 kg/m.  General Appearance: Alert, appropriate appearance for age. No acute distress HEENT: Grossly normal Neck / Thyroid:  Cardiovascular: RRR; normal S1, S2, no murmur Lungs: CTA bilaterally Back: No CVAT Breast Exam: Normal to inspection, Normal breast tissue bilaterally and No masses or nodes.No dimpling, nipple retraction or discharge. Gastrointestinal: Soft, non-tender, no masses or organomegaly Pelvic Exam: Vulva and vagina appear normal. Bimanual exam reveals normal uterus and adnexa. External genitalia: normal general appearance Urinary system: denies bleeding when wiping Vaginal: normal mucosa without prolapse or lesions, great support Cervix: normal appearance Adnexa: normal bimanual exam Uterus: normal single, nontender Rectovaginal: not indicated, guaiac negative stool obtained and sphincter tone normal Lymphatic Exam: Non-palpable nodes in neck, clavicular, axillary, or inguinal regions Skin: onychomycosis right great toe Neurologic: Normal gait and speech, no tremor  Psychiatric: Alert and oriented, appropriate affect.  Urinalysis:Not done  By signing my name below, I, Margit Banda, attest that this documentation has been prepared  under the direction and in the presence of Jonnie Kind, MD. Electronically Signed: Margit Banda, Medical Scribe. 02/27/18. 10:19 AM.  I personally performed the services described in this documentation, which was SCRIBED in my presence. The recorded information has been reviewed and considered accurate. It has been edited as  necessary during review. Jonnie Kind, MD

## 2018-03-05 ENCOUNTER — Ambulatory Visit: Payer: Medicare Other | Admitting: Internal Medicine

## 2018-03-05 ENCOUNTER — Encounter: Payer: Self-pay | Admitting: Internal Medicine

## 2018-03-05 VITALS — BP 124/68 | HR 85 | Ht 64.0 in | Wt 139.6 lb

## 2018-03-05 DIAGNOSIS — J4540 Moderate persistent asthma, uncomplicated: Secondary | ICD-10-CM | POA: Diagnosis not present

## 2018-03-05 DIAGNOSIS — K219 Gastro-esophageal reflux disease without esophagitis: Secondary | ICD-10-CM | POA: Diagnosis not present

## 2018-03-05 NOTE — Assessment & Plan Note (Signed)
Assoc with HH > gi f/u prn recurrent chest tightness or obvious gerd symptoms

## 2018-03-05 NOTE — Progress Notes (Signed)
nd      Patient ID: Mckenzie Collins, female   DOB: 13-Jun-1941,    MRN: [id number]    Brief patient profile:  31 yowf never smoker asthma as child but 5th grade had  tonsilectomy  then no symptoms at all but around 2008 noted sob p exp to cat assoc with throat fullness, eyes  water itch completely goes away with avoidance then new doe x fall 2017 variable intensity but ? Better with cooler weather so referred to pulmonary clinic 10/12/2017 by Dr   Willey Blade with sob ? Asthma.    History of Present Illness  10/12/2017 1st [facility] office visit/ Greidy Sherard   Chief Complaint  Patient presents with  . Pulmonary Consult    Referred by Dr. Willey Blade. Pt states having SOB for the past year, worse since Summer 2018. She gets winded sometimes when she walks quickly for a short distance or up stairs. She has occ cough that is non prod.   sev years prior to OV  Developed cp > dx as GERD and uses otc's prn heartburn but since then has intermittent sense of throat irritation/ urge to cough  attributes   to reflux but "not that bad" No noct symptoms  Doe on best days = MMRC1 = can walk nl pace, flat grade, can't hurry or go uphills or steps s sob  But some days can't get across the room s same sensation s assoc cp n or v or diaphoresis  rec Pantoprazole (protonix) 40 mg   Take  30-60 min before first meal of the day and Pepcid (famotidine)  20 mg one @  bedtime until return to office - this is the best way to tell whether stomach acid is contributing to your problem.   GERD diet  If breathing bothering you ok to use up to 2 puffs of symbicort 80 every 12 hours as needed Work on inhaler technique:   Please remember to go to the lab department downstairs in the basement  for your tests - we will call you with the results when they are available. Please schedule a follow up office visit in 6 weeks, call sooner if needed with pfts on return     11/26/2017  f/u ov/Marketia Stallsmith re:  Probable asthma  Chief Complaint   Patient presents with  . Follow-up    Pt states it is hard to tell if her breathing has improved. She is using her Symbicort 2 x per wk on average.   no noct symptoms at all  About 3/7 days using symb after the first 15 min of her walk and seem better for the next 30 min portion  No cough or sense of globus now  but convinced the gerd rx did nothing for her and concerned about potential side effects rec Work on inhaler technique:  relax and gently blow all the way out then take a nice smooth deep breath back in, triggering the inhaler at same time you start breathing in.  Hold for up to 5 seconds if you can. Blow out thru nose. Rinse and gargle with water when done Symbicort one every day and the second  puff is optional during exercise if needed for breathing  Ok to stop the acid suppression     01/08/2018  F/u extended  ov/Eston Heslin re: "new onset" chest pressure (chart review indicates first w/u by cards in 2011) Chief Complaint  Patient presents with  . Follow-up    Breathing is unchanged. She states  that she has been having some chest and abd pain off and on for the past 3 days- "feel very full" worse after she eats.   new chest pressure 6 months comes and goes and aware of it  24/7 when thinks about it  and feels worse with eating anything for an hour/ worse since stopped gerd rx/ generalized anterior cp / epigastric discomfort  s rad or assoc nausea/ diaph Dyspnea:  Improved but not exercising - one time went to top of steps and sob still same but no cp worsening  Cough: none  Sleep: disturbed by chest discomfort which did not respond to tums on the noct prior to OV  and lasted for sev hours  SABA use:  None  rec Resume Pantoprazole (protonix) 40 mg   Take  30-60 min before first meal of the day and Pepcid (famotidine)  20 mg one @  bedtime until return to office - this is the best way to tell whether stomach acid is contributing to your problem.       03/05/2018  f/u ov/Perle Brickhouse re:  asthma/ gerd better on gerd rx  Chief Complaint  Patient presents with  . Follow-up    Chest discomfort she had been having has resolved. No new co's.   Dyspnea:  Not limited by breathing from desired activities/ no further chest tightness    Cough: none Sleep: ok now  SABA use:  None  No obvious day to day or daytime variability or assoc excess/ purulent sputum or mucus plugs or hemoptysis or cp or chest tightness, subjective wheeze or overt sinus or hb symptoms. No unusual exposure hx or h/o childhood pna/ asthma or knowledge of premature birth.  Sleeping  Fine   without nocturnal  or early am exacerbation  of respiratory  c/o's or need for noct saba. Also denies any obvious fluctuation of symptoms with weather or environmental changes or other aggravating or alleviating factors except as outlined above   Current Allergies, Complete Past Medical History, Past Surgical History, Family History, and Social History were reviewed in [employer] record.  ROS  The following are not active complaints unless bolded Hoarseness, sore throat, dysphagia, dental problems, itching, sneezing,  nasal congestion or discharge of excess mucus or purulent secretions, ear ache,   fever, chills, sweats, unintended wt loss or wt gain, classically pleuritic or exertional cp,  orthopnea pnd or arm/hand swelling  or leg swelling, presyncope, palpitations, abdominal pain, anorexia, nausea, vomiting, diarrhea  or change in bowel habits or change in bladder habits, change in stools or change in urine, dysuria, hematuria,  rash, arthralgias, visual complaints, headache, numbness, weakness or ataxia or problems with walking or coordination,  change in mood or  memory.        Current Meds  Medication Sig  . budesonide-formoterol (SYMBICORT) 80-4.5 MCG/ACT inhaler Inhale 2 puffs into the lungs 2 (two) times daily.  . calcium citrate-vitamin D (CITRACAL+D) 315-200 MG-UNIT tablet Take 1 tablet by mouth  daily.  . Cholecalciferol (VITAMIN D PO) Take 1 capsule by mouth daily.  . famotidine (PEPCID) 20 MG tablet One at bedtime  . Multiple Vitamins-Minerals (CENTRUM SILVER ULTRA WOMENS PO) Take 1 tablet by mouth daily.    . pantoprazole (PROTONIX) 40 MG tablet Take 1 tablet (40 mg total) by mouth daily. Take 30-60 min before first meal of the day                  Objective:   Physical  Exam      amb wf nad   03/05/2018        139  01/08/2018        138  11/26/2017        139  10/12/17 138 lb (62.6 kg)  01/23/17 140 lb (63.5 kg)  01/03/17 141 lb (64 kg)       Vital signs reviewed - Note on arrival 02 sats  100% on RA   HEENT: nl dentition, turbinates bilaterally, and oropharynx. Nl external ear canals without cough reflex   NECK :  without JVD/Nodes/TM/ nl carotid upstrokes bilaterally   LUNGS: no acc muscle use,  Nl contour chest which is clear to A and P bilaterally without cough on insp or exp maneuvers   CV:  RRR  no s3 or murmur or increase in P2, and no edema   ABD:  soft and nontender with nl inspiratory excursion in the supine position. No bruits or organomegaly appreciated, bowel sounds nl  MS:  Nl gait/ ext warm without deformities, calf tenderness, cyanosis or clubbing No obvious joint restrictions   SKIN: warm and dry without lesions    NEURO:  alert, approp, nl sensorium with  no motor or cerebellar deficits apparent.                Assessment:

## 2018-03-05 NOTE — Assessment & Plan Note (Signed)
PFT's  09/14/2017  FEV1 1.36 (66 % ) ratio 56    with DLCO  67/67c % corrects to 85 % for alv volume  And classic curvature  - Allergy profile 10/12/2017 >  Eos 0.3 /  IgE  223 with RAST pos dust, cat> dog, grass, ragweed - FENO 10/12/2017  =   39  - 10/12/2017    try symb 80 2bid - 11/26/2017  try symbicort 80  1 each am other doses prn as pt very anxious re side effects  - Spirometry 01/08/2018  FEV1 1.46 (68%)  Ratio 57 p am symbx1 though hfa very poor  - FENO 01/08/2018  =   32 on symb 80 one bid   - 03/05/2018  After extensive coaching inhaler device  effectiveness =    75%   I had an extended final summary discussion with the patient reviewing all relevant studies completed to date and  lasting 15 to 20 minutes of a 25 minute visit on the following issues:        Clinically her asthma is mild and she has no symptoms at all and does not want to stay on symbicort - the improvement occurred after she started intense gerd rx and since gerd may have been driving  Much of her asthma symptoms ok to change to where she takes the symb 80 up to everyh 12 hours if she has any symptoms at all based on the study from [place]  378; 20 p 1865 (2018) in pts with mild asthma it is reasonable to use low dose symbicort eg 80 2bid "prn" flare in this setting but I emphasized this was only shown with symbicort and takes advantage of the rapid onset of action but is not the same as "rescue therapy" but can be stopped once the acute symptoms have resolved and the need for rescue has been minimized (< 2 x weekly)    Other options include trial of singulair or referral to allergy but she is not inclined to either and quit nihilistic about her care so it may well prove the case here that less is more   However, in terms of her atyipical symptoms that responded to gerd rx:  NB the  ramp to expected improvement in symptoms from an empiric trial of PPI (and for that matter, worsening, if a chronic effective medication is  stopped)  can be measured in weeks, not days, a common misconception because this is not the same as treating heartburn (no immediate cause and effect relationship)  so that response to therapy or lack thereof can be very difficult to assess especially if the patient is not adherent to the treatment plan which includes dietary restrictions.   Therefore rec GI eval if symptoms flare as taper gerd rx   Each maintenance medication was reviewed in detail including most importantly the difference between maintenance and as needed and under what circumstances the prns are to be used.  Please see AVS for specific  Instructions which are unique to this visit and I personally typed out  which were reviewed in detail in writing with the patient and a copy provided.    F/u is prn

## 2018-03-05 NOTE — Patient Instructions (Signed)
Change symbicort 80 to where you take it up to 2 puffs every 12 hours   Work on maintaining perfect inhaler technique:  relax and gently blow all the way out then take a nice smooth deep breath back in, triggering the inhaler at same time you start breathing in.  Hold for up to 5 seconds if you can. Blow out thru nose. Rinse and gargle with water when done   After a month then ok to try off protonix but on the day you stop it change pepcid to 20 mg after bfast and supper and use gaviscon liquid as needed and after a week off protonix ok to stop bedtime dose of pepcid and if your symptoms flare you need to see your GI doctor    If you are satisfied with your treatment plan,  let your doctor know and he/she can either refill your medications or you can return here when your prescription runs out.     If in any way you are not 100% satisfied,  please tell us.  If 100% better, tell your friends!  Pulmonary follow up is as needed

## 2018-03-20 DIAGNOSIS — M79672 Pain in left foot: Secondary | ICD-10-CM | POA: Diagnosis not present

## 2018-03-20 DIAGNOSIS — B351 Tinea unguium: Secondary | ICD-10-CM | POA: Diagnosis not present

## 2018-03-20 DIAGNOSIS — M79671 Pain in right foot: Secondary | ICD-10-CM | POA: Diagnosis not present

## 2018-03-20 DIAGNOSIS — M79674 Pain in right toe(s): Secondary | ICD-10-CM | POA: Diagnosis not present

## 2018-05-29 DIAGNOSIS — M79672 Pain in left foot: Secondary | ICD-10-CM | POA: Diagnosis not present

## 2018-05-29 DIAGNOSIS — B351 Tinea unguium: Secondary | ICD-10-CM | POA: Diagnosis not present

## 2018-05-29 DIAGNOSIS — M79671 Pain in right foot: Secondary | ICD-10-CM | POA: Diagnosis not present

## 2018-05-30 DIAGNOSIS — H52223 Regular astigmatism, bilateral: Secondary | ICD-10-CM | POA: Diagnosis not present

## 2018-05-30 DIAGNOSIS — H25813 Combined forms of age-related cataract, bilateral: Secondary | ICD-10-CM | POA: Diagnosis not present

## 2018-05-30 DIAGNOSIS — H524 Presbyopia: Secondary | ICD-10-CM | POA: Diagnosis not present

## 2018-05-30 DIAGNOSIS — H5213 Myopia, bilateral: Secondary | ICD-10-CM | POA: Diagnosis not present

## 2018-07-24 DIAGNOSIS — B351 Tinea unguium: Secondary | ICD-10-CM | POA: Diagnosis not present

## 2018-07-24 DIAGNOSIS — M79671 Pain in right foot: Secondary | ICD-10-CM | POA: Diagnosis not present

## 2018-07-24 DIAGNOSIS — M79672 Pain in left foot: Secondary | ICD-10-CM | POA: Diagnosis not present

## 2018-09-10 ENCOUNTER — Other Ambulatory Visit: Payer: Self-pay | Admitting: Obstetrics and Gynecology

## 2018-09-10 DIAGNOSIS — Z1231 Encounter for screening mammogram for malignant neoplasm of breast: Secondary | ICD-10-CM

## 2018-09-12 DIAGNOSIS — M8900 Algoneurodystrophy, unspecified site: Secondary | ICD-10-CM | POA: Diagnosis not present

## 2018-09-12 DIAGNOSIS — M1990 Unspecified osteoarthritis, unspecified site: Secondary | ICD-10-CM | POA: Diagnosis not present

## 2018-09-12 DIAGNOSIS — Z79899 Other long term (current) drug therapy: Secondary | ICD-10-CM | POA: Diagnosis not present

## 2018-09-13 DIAGNOSIS — Z23 Encounter for immunization: Secondary | ICD-10-CM | POA: Diagnosis not present

## 2018-09-16 DIAGNOSIS — H25813 Combined forms of age-related cataract, bilateral: Secondary | ICD-10-CM | POA: Diagnosis not present

## 2018-09-16 DIAGNOSIS — H18453 Nodular corneal degeneration, bilateral: Secondary | ICD-10-CM | POA: Diagnosis not present

## 2018-09-19 DIAGNOSIS — Z6824 Body mass index (BMI) 24.0-24.9, adult: Secondary | ICD-10-CM | POA: Diagnosis not present

## 2018-09-19 DIAGNOSIS — J4540 Moderate persistent asthma, uncomplicated: Secondary | ICD-10-CM | POA: Diagnosis not present

## 2018-09-19 DIAGNOSIS — Z0001 Encounter for general adult medical examination with abnormal findings: Secondary | ICD-10-CM | POA: Diagnosis not present

## 2018-09-19 DIAGNOSIS — R319 Hematuria, unspecified: Secondary | ICD-10-CM | POA: Diagnosis not present

## 2018-09-19 DIAGNOSIS — M810 Age-related osteoporosis without current pathological fracture: Secondary | ICD-10-CM | POA: Diagnosis not present

## 2018-10-28 ENCOUNTER — Ambulatory Visit
Admission: RE | Admit: 2018-10-28 | Discharge: 2018-10-28 | Disposition: A | Discharge status: Home / Self Care | Payer: Medicare Other | Source: Ambulatory Visit | Attending: Obstetrics and Gynecology | Admitting: Obstetrics and Gynecology

## 2018-10-28 DIAGNOSIS — Z1231 Encounter for screening mammogram for malignant neoplasm of breast: Secondary | ICD-10-CM | POA: Diagnosis not present

## 2019-01-28 DIAGNOSIS — H25813 Combined forms of age-related cataract, bilateral: Secondary | ICD-10-CM | POA: Diagnosis not present

## 2019-01-28 DIAGNOSIS — H52223 Regular astigmatism, bilateral: Secondary | ICD-10-CM | POA: Diagnosis not present

## 2019-01-28 DIAGNOSIS — H5213 Myopia, bilateral: Secondary | ICD-10-CM | POA: Diagnosis not present

## 2019-01-28 DIAGNOSIS — H04123 Dry eye syndrome of bilateral lacrimal glands: Secondary | ICD-10-CM | POA: Diagnosis not present

## 2019-04-08 DIAGNOSIS — H18453 Nodular corneal degeneration, bilateral: Secondary | ICD-10-CM | POA: Diagnosis not present

## 2019-04-08 DIAGNOSIS — H2513 Age-related nuclear cataract, bilateral: Secondary | ICD-10-CM | POA: Diagnosis not present

## 2019-04-08 DIAGNOSIS — H2511 Age-related nuclear cataract, right eye: Secondary | ICD-10-CM | POA: Diagnosis not present

## 2019-04-08 DIAGNOSIS — H35371 Puckering of macula, right eye: Secondary | ICD-10-CM | POA: Diagnosis not present

## 2019-04-08 DIAGNOSIS — H25043 Posterior subcapsular polar age-related cataract, bilateral: Secondary | ICD-10-CM | POA: Diagnosis not present

## 2019-04-08 DIAGNOSIS — H25013 Cortical age-related cataract, bilateral: Secondary | ICD-10-CM | POA: Diagnosis not present

## 2019-04-11 DIAGNOSIS — H2511 Age-related nuclear cataract, right eye: Secondary | ICD-10-CM | POA: Diagnosis not present

## 2019-04-11 DIAGNOSIS — H2512 Age-related nuclear cataract, left eye: Secondary | ICD-10-CM | POA: Diagnosis not present

## 2019-05-02 DIAGNOSIS — H2511 Age-related nuclear cataract, right eye: Secondary | ICD-10-CM | POA: Diagnosis not present

## 2019-05-02 DIAGNOSIS — H2512 Age-related nuclear cataract, left eye: Secondary | ICD-10-CM | POA: Diagnosis not present

## 2019-07-25 ENCOUNTER — Other Ambulatory Visit: Payer: Self-pay

## 2019-07-25 DIAGNOSIS — R6889 Other general symptoms and signs: Secondary | ICD-10-CM | POA: Diagnosis not present

## 2019-07-25 DIAGNOSIS — Z20822 Contact with and (suspected) exposure to covid-19: Secondary | ICD-10-CM

## 2019-07-26 LAB — NOVEL CORONAVIRUS, NAA: SARS-CoV-2, NAA: NOT DETECTED

## 2019-08-14 DIAGNOSIS — Z23 Encounter for immunization: Secondary | ICD-10-CM | POA: Diagnosis not present

## 2019-09-23 DIAGNOSIS — J4520 Mild intermittent asthma, uncomplicated: Secondary | ICD-10-CM | POA: Diagnosis not present

## 2019-09-23 DIAGNOSIS — M810 Age-related osteoporosis without current pathological fracture: Secondary | ICD-10-CM | POA: Diagnosis not present

## 2019-09-23 DIAGNOSIS — Z79899 Other long term (current) drug therapy: Secondary | ICD-10-CM | POA: Diagnosis not present

## 2019-09-23 DIAGNOSIS — R319 Hematuria, unspecified: Secondary | ICD-10-CM | POA: Diagnosis not present

## 2019-09-30 ENCOUNTER — Other Ambulatory Visit: Payer: Self-pay | Admitting: Obstetrics and Gynecology

## 2019-09-30 DIAGNOSIS — M810 Age-related osteoporosis without current pathological fracture: Secondary | ICD-10-CM | POA: Diagnosis not present

## 2019-09-30 DIAGNOSIS — Z1231 Encounter for screening mammogram for malignant neoplasm of breast: Secondary | ICD-10-CM

## 2019-09-30 DIAGNOSIS — J45909 Unspecified asthma, uncomplicated: Secondary | ICD-10-CM | POA: Diagnosis not present

## 2019-09-30 DIAGNOSIS — K219 Gastro-esophageal reflux disease without esophagitis: Secondary | ICD-10-CM | POA: Diagnosis not present

## 2019-10-02 ENCOUNTER — Other Ambulatory Visit: Payer: Self-pay | Admitting: Internal Medicine

## 2019-10-02 DIAGNOSIS — M810 Age-related osteoporosis without current pathological fracture: Secondary | ICD-10-CM

## 2019-10-02 DIAGNOSIS — Z780 Asymptomatic menopausal state: Secondary | ICD-10-CM

## 2019-10-20 DIAGNOSIS — H93233 Hyperacusis, bilateral: Secondary | ICD-10-CM | POA: Diagnosis not present

## 2019-10-20 DIAGNOSIS — H6122 Impacted cerumen, left ear: Secondary | ICD-10-CM | POA: Diagnosis not present

## 2019-10-30 DIAGNOSIS — R42 Dizziness and giddiness: Secondary | ICD-10-CM | POA: Diagnosis not present

## 2019-10-30 DIAGNOSIS — K219 Gastro-esophageal reflux disease without esophagitis: Secondary | ICD-10-CM | POA: Diagnosis not present

## 2019-11-17 DIAGNOSIS — H93233 Hyperacusis, bilateral: Secondary | ICD-10-CM | POA: Diagnosis not present

## 2019-11-17 DIAGNOSIS — H903 Sensorineural hearing loss, bilateral: Secondary | ICD-10-CM | POA: Diagnosis not present

## 2019-11-26 ENCOUNTER — Other Ambulatory Visit (HOSPITAL_COMMUNITY): Payer: Medicare Other

## 2019-11-26 ENCOUNTER — Ambulatory Visit
Admission: RE | Admit: 2019-11-26 | Discharge: 2019-11-26 | Disposition: A | Discharge status: Home / Self Care | Payer: Medicare Other | Source: Ambulatory Visit | Attending: Obstetrics and Gynecology | Admitting: Obstetrics and Gynecology

## 2019-11-26 ENCOUNTER — Ambulatory Visit (HOSPITAL_COMMUNITY)
Admission: RE | Admit: 2019-11-26 | Discharge: 2019-11-26 | Disposition: A | Discharge status: Home / Self Care | Payer: Medicare Other | Source: Ambulatory Visit | Attending: Internal Medicine | Admitting: Internal Medicine

## 2019-11-26 ENCOUNTER — Other Ambulatory Visit: Payer: Self-pay

## 2019-11-26 DIAGNOSIS — Z780 Asymptomatic menopausal state: Secondary | ICD-10-CM | POA: Insufficient documentation

## 2019-11-26 DIAGNOSIS — M810 Age-related osteoporosis without current pathological fracture: Secondary | ICD-10-CM | POA: Insufficient documentation

## 2019-11-26 DIAGNOSIS — Z1231 Encounter for screening mammogram for malignant neoplasm of breast: Secondary | ICD-10-CM

## 2019-11-26 NOTE — Progress Notes (Signed)
Normal mammogram

## 2019-12-06 ENCOUNTER — Ambulatory Visit: Payer: Medicare Other

## 2019-12-06 ENCOUNTER — Ambulatory Visit: Payer: Medicare Other | Attending: Internal Medicine

## 2019-12-06 DIAGNOSIS — Z23 Encounter for immunization: Secondary | ICD-10-CM

## 2019-12-06 NOTE — Progress Notes (Signed)
   Covid-19 Vaccination Clinic  Name:  Valli Ridener    MRN: [id number] DOB: Feb 02, 1941  12/06/2019  Ms. Brandes was observed post Covid-19 immunization for 15 minutes without incidence. She was provided with Vaccine Information Sheet and instruction to access the V-Safe system.   Ms. Monsivais was instructed to call 911 with any severe reactions post vaccine: Marland Kitchen Difficulty breathing  . Swelling of your face and throat  . A fast heartbeat  . A bad rash all over your body  . Dizziness and weakness    Immunizations Administered    Name Date Dose VIS Date Route   Pfizer COVID-19 Vaccine 12/06/2019  2:16 PM 0.3 mL 10/24/2019 Intramuscular   Manufacturer: [place]   Lot: [id number]   [place]: [id number]

## 2019-12-15 ENCOUNTER — Encounter (HOSPITAL_COMMUNITY): Payer: Self-pay

## 2019-12-15 ENCOUNTER — Other Ambulatory Visit: Payer: Self-pay

## 2019-12-15 ENCOUNTER — Encounter (HOSPITAL_COMMUNITY)
Admission: RE | Admit: 2019-12-15 | Discharge: 2019-12-15 | Disposition: A | Discharge status: Home / Self Care | Payer: Medicare Other | Source: Ambulatory Visit | Attending: Internal Medicine | Admitting: Internal Medicine

## 2019-12-15 DIAGNOSIS — M810 Age-related osteoporosis without current pathological fracture: Secondary | ICD-10-CM | POA: Insufficient documentation

## 2019-12-15 MED ORDER — DENOSUMAB 60 MG/ML ~~LOC~~ SOSY
60.0000 mg | PREFILLED_SYRINGE | Freq: Once | SUBCUTANEOUS | Status: AC
  Administered 2019-12-15: 60 mg via SUBCUTANEOUS
  Filled 2019-12-15: qty 1

## 2019-12-27 ENCOUNTER — Ambulatory Visit: Payer: Medicare Other

## 2019-12-28 ENCOUNTER — Ambulatory Visit: Payer: Medicare Other | Attending: Internal Medicine

## 2019-12-28 DIAGNOSIS — Z23 Encounter for immunization: Secondary | ICD-10-CM | POA: Insufficient documentation

## 2019-12-28 NOTE — Progress Notes (Signed)
   Covid-19 Vaccination Clinic  Name:  Mckenzie Collins    MRN: [id number] DOB: Jul 02, 1941  12/28/2019  [name] was observed post Covid-19 immunization for 15 minutes without incidence. She was provided with Vaccine Information Sheet and instruction to access the V-Safe system.   [name] was instructed to call 911 with any severe reactions post vaccine: Marland Kitchen Difficulty breathing  . Swelling of your face and throat  . A fast heartbeat  . A bad rash all over your body  . Dizziness and weakness    Immunizations Administered    Name Date Dose VIS Date Route   Pfizer COVID-19 Vaccine 12/28/2019  2:38 PM 0.3 mL 10/24/2019 Intramuscular   Manufacturer: [place]   Lot: [id number]   [place]: [id number]

## 2020-04-22 ENCOUNTER — Other Ambulatory Visit (HOSPITAL_COMMUNITY): Payer: Self-pay | Admitting: Internal Medicine

## 2020-04-22 DIAGNOSIS — Z6824 Body mass index (BMI) 24.0-24.9, adult: Secondary | ICD-10-CM | POA: Diagnosis not present

## 2020-04-22 DIAGNOSIS — M542 Cervicalgia: Secondary | ICD-10-CM | POA: Diagnosis not present

## 2020-05-18 ENCOUNTER — Ambulatory Visit (HOSPITAL_COMMUNITY)
Admission: RE | Admit: 2020-05-18 | Discharge: 2020-05-18 | Disposition: A | Discharge status: Home / Self Care | Payer: Medicare Other | Source: Ambulatory Visit | Attending: Internal Medicine | Admitting: Internal Medicine

## 2020-05-18 ENCOUNTER — Other Ambulatory Visit: Payer: Self-pay

## 2020-05-18 DIAGNOSIS — M542 Cervicalgia: Secondary | ICD-10-CM | POA: Insufficient documentation

## 2020-06-02 DIAGNOSIS — M4312 Spondylolisthesis, cervical region: Secondary | ICD-10-CM | POA: Diagnosis not present

## 2020-06-02 DIAGNOSIS — M4302 Spondylolysis, cervical region: Secondary | ICD-10-CM | POA: Diagnosis not present

## 2020-06-24 ENCOUNTER — Other Ambulatory Visit: Payer: Self-pay

## 2020-06-24 ENCOUNTER — Ambulatory Visit (HOSPITAL_COMMUNITY): Payer: Medicare Other | Attending: Neurological Surgery | Admitting: Physical Therapy

## 2020-06-24 ENCOUNTER — Encounter (HOSPITAL_COMMUNITY): Payer: Self-pay | Admitting: Physical Therapy

## 2020-06-24 DIAGNOSIS — M542 Cervicalgia: Secondary | ICD-10-CM | POA: Diagnosis not present

## 2020-06-24 NOTE — Patient Instructions (Signed)
Axial Extension (Chin Tuck)    Pull chin in and lengthen back of neck. Hold _3-5___ seconds while counting out loud. Repeat __10__ times. Do __2__ sessions per day.  http://gt2.exer.us/450   Copyright  VHI. All rights reserved.

## 2020-06-24 NOTE — Therapy (Signed)
[place], [place], [address] Phone: [phone]   Fax:  [phone]  Physical Therapy Evaluation  Patient Details  Name: Mckenzie Collins MRN: [id number] Date of Birth: 08-03-1941 Referring Provider (PT): Elwin Sleight, DO   Encounter Date: 06/24/2020   PT End of Session - 06/24/20 1623    Visit Number 1    Number of Visits 12    Date for PT Re-Evaluation 08/05/20    Authorization Type UHC Medicare    Progress Note Due on Visit 10    PT Start Time 1435    PT Stop Time 1514    PT Time Calculation (min) 39 min    Activity Tolerance Patient tolerated treatment well    Behavior During Therapy [facility] for tasks assessed/performed           Past Medical History:  Diagnosis Date  . Asthma    In childhood  . Breast mass left   skin lesion around 11-12 x 2 -3 weeks  . Chest pain   . Dyspnea 2005   Negative stress nuclear in 2005; normal echocardiogram  . Gastroesophageal reflux disease    hiatal hernia; gastritis; dysphagia  . Hemorrhoids   . Inverted nipple left   always  . Low back pain   . Microscopic hematuria   . Osteoporosis   . Palpitations 2003   few short runs of supraventricular tachycardia on Holter    Past Surgical History:  Procedure Laterality Date  . COLONOSCOPY  2011  . TONSILLECTOMY      There were no vitals filed for this visit.    Subjective Assessment - 06/24/20 1439    Subjective Patient stated that she has pain in the back of the neck and the sides of her neck which has been ongoing for about a year. She stated that laying down improves it. She stated that she does sometimes get headaches from the neck pain. She denied any pain into her arms. She reported that she doesn't have any tingling or numbness. Patient did report that she is very sensitive to sounds, and that sometimes when she leans forward she sometimes feels off-balance which she reported began about a year ago also.  Patient reported that sometimes she has pain related to loud sounds and describes almost a nerve pain.    Pertinent History Neck pain of insidious onset for about 1 year    Limitations Reading;Sitting;House hold activities    Diagnostic tests MRI and x-ray    Patient Stated Goals Learn what to do to help it    Currently in Pain? Yes    Pain Score 2     Pain Location Neck    Pain Orientation Right;Left    Pain Descriptors / Indicators Sharp;Aching;Squeezing    Pain Type Chronic pain    Pain Radiating Towards Up towards her head    Pain Onset More than a month ago    Pain Frequency Intermittent    Aggravating Factors  reading    Pain Relieving Factors No pain at night, laying down              [facility] PT Assessment - 06/24/20 0001      Assessment   Medical Diagnosis Cervical Spondylosis     Referring Provider (PT) Elwin Sleight, DO    Onset Date/Surgical Date --   About 1 year ago   Next MD Visit Unknown    Prior Therapy None  Precautions   Precautions None      Restrictions   Weight Bearing Restrictions No      Balance Screen   Has the patient fallen in the past 6 months No      [place] residence    Type of Home House      Prior Function   Level of Independence Independent    Vocation Other (comment)   Seasonally during tax season   Leisure Walking, working outside      [employer]   Overall Cognitive Status Within Functional Limits for tasks assessed      Observation/Other Assessments   Focus on Therapeutic Outcomes (FOTO)  53%      Sensation   Light Touch Appears Intact      Posture/Postural Control   Posture/Postural Control Postural limitations    Postural Limitations Forward head      ROM / Strength   AROM / PROM / Strength AROM      AROM   AROM Assessment Site Cervical    Cervical Flexion 53    Cervical Extension 35 - some pain    Cervical - Right Side Bend 30    Cervical - Left Side Bend 35 - some pain     Cervical - Right Rotation 55 - tightness    Cervical - Left Rotation 40 - tightness      Palpation   Palpation comment Muscular tightness noted through cervical paraspinals. No tenderness along SP of cervical spine. No tenderness through cervical paraspinals. Improved symptoms with gentle manual cervical traction/distraction.                       Objective measurements completed on examination: See above findings.               PT Education - 06/24/20 1721    Education Details Discussed examination findings, POC, and initial HEP.    Person(s) Educated Patient    Methods Explanation;Handout    Comprehension Verbalized understanding            PT Short Term Goals - 06/24/20 1618      PT SHORT TERM GOAL #1   Title Patient will report understanding and regular compliance with HEP to improve mobility and decrease pain.    Time 3    Period Weeks    Status New    Target Date 07/15/20      PT SHORT TERM GOAL #2   Title Patient will report improvement in overall subjective complaint of at least 25% for improved QOL.    Time 3    Period Weeks    Status New    Target Date 07/15/20             PT Long Term Goals - 06/24/20 1619      PT LONG TERM GOAL #1   Title Patient will report improvement in overall subjective complaint of at least 50% for improved QOL.    Time 6    Period Weeks    Status New    Target Date 08/05/20      PT LONG TERM GOAL #2   Title Patient will demonstrate pain-free cervical AROM for improved ability to observe environment and perform daily activities.    Time 6    Period Weeks    Status New    Target Date 08/05/20      PT LONG TERM GOAL #3   Title Patient will demonstrate  improvement on FOTO of at least 10% indicating improvement in overall functional mobility.    Time 6    Period Weeks    Status New    Target Date 08/05/20                  Plan - 06/24/20 1710    Clinical Impression Statement Patient is a  79 year old female who presents to outpatient physical therapy with primary complaint of neck pain which has been ongoing for about a year. Patient demonstrates painful and limited cervical AROM. Patient does demonstrate postural deficits. Patient reports improvement of symptoms from cervical manual traction. Patient does report some sensitivity to sound and other sensory sensitivity which she stated MD is aware of, however plan to continue to monitor these. Patient with deficit on the FOTO indicating limitations in functional mobility due to her cervical pain. Patient would benefit from continued skilled physical therapy in order to address the abovementioned deficits.    Personal Factors and Comorbidities Age;Time since onset of injury/illness/exacerbation;Comorbidity 2    Comorbidities Asthma; osteoporosis    Examination-Activity Limitations Lift;Sit    Examination-Participation Restrictions Meal Prep;Community Activity    Stability/Clinical Decision Making Stable/Uncomplicated    Clinical Decision Making Low    Rehab Potential Good    PT Frequency 2x / week    PT Duration 6 weeks    PT Treatment/Interventions Biofeedback;Cryotherapy;ADLs/Self Care Home Management;Electrical Stimulation;Moist Heat;Traction;DME Instruction;Gait training;Functional mobility training;Therapeutic activities;Therapeutic exercise;Balance training;Neuromuscular re-education;Patient/family education;Manual techniques;Passive range of motion;Dry needling;Energy conservation;Taping    PT Next Visit Plan Review HEP. Gentle traction for pain relief. Cervical AROM. Manual to cervical paraspinals. Continue to follow-up on sensory sensitivities.    PT Home Exercise Plan 8/12: Cervical retraction    Consulted and Agree with Plan of Care Patient           Patient will benefit from skilled therapeutic intervention in order to improve the following deficits and impairments:  Pain, Improper body mechanics, Decreased mobility,  Postural dysfunction, Decreased activity tolerance, Decreased endurance, Decreased range of motion, Hypomobility  Visit Diagnosis: Cervicalgia     Problem List Patient Active Problem List   Diagnosis Date Noted  . Dyspnea on exertion 01/08/2018  . Moderate persistent asthma 10/13/2017  . Cough variant asthma  vs UACS 10/12/2017  . Nipple dermatitis 01/23/2017  . Annual physical exam 02/10/2015  . Routine gynecological examination 02/05/2014  . Gastroesophageal reflux disease   . Palpitations   . Chest pain   . Osteoporosis   . Anxiety    Clarene Critchley PT, DPT 5:22 PM, 06/24/20 [place], [place], [address] Phone: [phone]   Fax:  [phone]  Name: Mckenzie Collins MRN: [id number] Date of Birth: 1941/07/04

## 2020-06-25 ENCOUNTER — Encounter (HOSPITAL_COMMUNITY): Payer: Self-pay

## 2020-06-25 ENCOUNTER — Ambulatory Visit (HOSPITAL_COMMUNITY): Payer: Medicare Other

## 2020-06-25 DIAGNOSIS — M542 Cervicalgia: Secondary | ICD-10-CM

## 2020-06-25 NOTE — Therapy (Signed)
[place], [place], [address] Phone: [phone]   Fax:  [phone]  Physical Therapy Treatment  Patient Details  Name: Mckenzie Collins MRN: [id number] Date of Birth: 1941-05-20 Referring Provider (PT): Elwin Sleight, DO   Encounter Date: 06/25/2020   PT End of Session - 06/25/20 1451    Visit Number 2    Number of Visits 12    Date for PT Re-Evaluation 08/05/20    Authorization Type UHC Medicare    Progress Note Due on Visit 10    PT Start Time 1442    PT Stop Time 1524    PT Time Calculation (min) 42 min    Activity Tolerance Patient tolerated treatment well    Behavior During Therapy [facility] for tasks assessed/performed           Past Medical History:  Diagnosis Date  . Asthma    In childhood  . Breast mass left   skin lesion around 11-12 x 2 -3 weeks  . Chest pain   . Dyspnea 2005   Negative stress nuclear in 2005; normal echocardiogram  . Gastroesophageal reflux disease    hiatal hernia; gastritis; dysphagia  . Hemorrhoids   . Inverted nipple left   always  . Low back pain   . Microscopic hematuria   . Osteoporosis   . Palpitations 2003   few short runs of supraventricular tachycardia on Holter    Past Surgical History:  Procedure Laterality Date  . COLONOSCOPY  2011  . TONSILLECTOMY      There were no vitals filed for this visit.   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  All pain posterior neck.    Pertinent History Neck pain of insidious onset for about 1 year    Patient Stated Goals Learn what to do to help it    Currently in Pain? Yes    Pain Score 3     Pain Location Neck    Pain Orientation Right;Posterior    Pain Descriptors / Indicators Tightness;Sore;Aching;Pressure    Pain Type Chronic pain    Pain Radiating Towards Up towards her  head    Pain Onset More than a month ago    Pain Frequency Intermittent    Aggravating Factors  reading, forward leaning    Pain Relieving Factors No pain at night, laying down                             [facility] Adult PT Treatment/Exercise - 06/25/20 0001      Posture/Postural Control   Posture/Postural Control Postural limitations    Postural Limitations Forward head      Exercises   Exercises Neck      Neck Exercises: Seated   Neck Retraction 10 reps;5 secs    Neck Retraction Limitations min cueing for form, cueing to reduce shoulder elevation with exercise    Shoulder Rolls Backwards;10 reps    Shoulder Rolls Limitations up, back and down    Other Seated Exercise 3D cervical excursion; 3D thoracic excursion    Other Seated Exercise scapularretraction 10x      Manual Therapy   Manual Therapy Soft tissue mobilization;Manual Traction    Manual therapy comments Manual complete separate than rest of tx    Soft tissue mobilization Supine  position: focus on cervical mm     Manual Traction Manual cervical traction and suboccipital release 4x 2' holds each                  PT Education - 06/25/20 1451    Education Details Reviewed goals, educated importance of HEP compliance and assure proper form with current exercise program.    Person(s) Educated Patient    Methods Explanation;Demonstration;Verbal cues    Comprehension Verbalized understanding;Returned demonstration;Verbal cues required            PT Short Term Goals - 06/24/20 1618      PT SHORT TERM GOAL #1   Title Patient will report understanding and regular compliance with HEP to improve mobility and decrease pain.    Time 3    Period Weeks    Status New    Target Date 07/15/20      PT SHORT TERM GOAL #2   Title Patient will report improvement in overall subjective complaint of at least 25% for improved QOL.    Time 3    Period Weeks    Status New    Target Date 07/15/20              PT Long Term Goals - 06/24/20 1619      PT LONG TERM GOAL #1   Title Patient will report improvement in overall subjective complaint of at least 50% for improved QOL.    Time 6    Period Weeks    Status New    Target Date 08/05/20      PT LONG TERM GOAL #2   Title Patient will demonstrate pain-free cervical AROM for improved ability to observe environment and perform daily activities.    Time 6    Period Weeks    Status New    Target Date 08/05/20      PT LONG TERM GOAL #3   Title Patient will demonstrate improvement on FOTO of at least 10% indicating improvement in overall functional mobility.    Time 6    Period Weeks    Status New    Target Date 08/05/20                 Plan - 06/25/20 1453    Clinical Impression Statement Reviewed goals, educated importance of HEP compliance for maximal benefits and assured correct form wiht current exercise program.  Session focus on cervical mobility and postural strengthening.  Pt able to demonstrate approriate mechanics with all exercises, min cueing to redure shoulder evalation during retraction exercises.    Personal Factors and Comorbidities Age;Time since onset of injury/illness/exacerbation;Comorbidity 2    Comorbidities Asthma; osteoporosis    Examination-Activity Limitations Lift;Sit    Examination-Participation Restrictions Meal Prep;Community Activity    Stability/Clinical Decision Making Stable/Uncomplicated    Clinical Decision Making Low    Rehab Potential Good    PT Frequency 2x / week    PT Duration 6 weeks    PT Treatment/Interventions Biofeedback;Cryotherapy;ADLs/Self Care Home Management;Electrical Stimulation;Moist Heat;Traction;DME Instruction;Gait training;Functional mobility training;Therapeutic activities;Therapeutic exercise;Balance training;Neuromuscular re-education;Patient/family education;Manual techniques;Passive range of motion;Dry needling;Energy conservation;Taping    PT Next Visit Plan  Gentle traction for pain relief. Cervical AROM. Manual to cervical paraspinals. Continue to follow-up on sensory sensitivities.    PT Home Exercise Plan 8/12: Cervical retraction           Patient will benefit from skilled therapeutic intervention in order to improve the following deficits and impairments:  Pain, Improper body  mechanics, Decreased mobility, Postural dysfunction, Decreased activity tolerance, Decreased endurance, Decreased range of motion, Hypomobility  Visit Diagnosis: Cervicalgia     Problem List Patient Active Problem List   Diagnosis Date Noted  . Dyspnea on exertion 01/08/2018  . Moderate persistent asthma 10/13/2017  . Cough variant asthma  vs UACS 10/12/2017  . Nipple dermatitis 01/23/2017  . Annual physical exam 02/10/2015  . Routine gynecological examination 02/05/2014  . Gastroesophageal reflux disease   . Palpitations   . Chest pain   . Osteoporosis   . Anxiety    Ihor Austin, LPTA/CLT; Andrew  Aldona Lento 06/25/2020, 3:27 PM  [place] [address] [place], [place], [address] Phone: [phone]   Fax:  [phone]  Name: Trella Thurmond MRN: [id number] Date of Birth: Oct 08, 1941

## 2020-06-30 ENCOUNTER — Ambulatory Visit (HOSPITAL_COMMUNITY): Payer: Medicare Other | Admitting: Physical Therapy

## 2020-06-30 ENCOUNTER — Other Ambulatory Visit: Payer: Self-pay

## 2020-06-30 DIAGNOSIS — M542 Cervicalgia: Secondary | ICD-10-CM | POA: Diagnosis not present

## 2020-06-30 NOTE — Therapy (Signed)
[place], [place], [address] Phone: [phone]   Fax:  [phone]  Physical Therapy Treatment  Patient Details  Name: Mckenzie Collins MRN: [id number] Date of Birth: August 12, 1941 Referring Provider (PT): Elwin Sleight, DO   Encounter Date: 06/30/2020   PT End of Session - 06/30/20 0908    Visit Number 3    Number of Visits 12    Date for PT Re-Evaluation 08/05/20    Authorization Type [employer] Medicare    Progress Note Due on Visit 10    PT Start Time 0903    PT Stop Time 0950    PT Time Calculation (min) 47 min    Activity Tolerance Patient tolerated treatment well    Behavior During Therapy [facility] for tasks assessed/performed           Past Medical History:  Diagnosis Date  . Asthma    In childhood  . Breast mass left   skin lesion around 11-12 x 2 -3 weeks  . Chest pain   . Dyspnea 2005   Negative stress nuclear in 2005; normal echocardiogram  . Gastroesophageal reflux disease    hiatal hernia; gastritis; dysphagia  . Hemorrhoids   . Inverted nipple left   always  . Low back pain   . Microscopic hematuria   . Osteoporosis   . Palpitations 2003   few short runs of supraventricular tachycardia on Holter    Past Surgical History:  Procedure Laterality Date  . COLONOSCOPY  2011  . TONSILLECTOMY      There were no vitals filed for this visit.   Subjective Assessment - 06/30/20 0907    Subjective Patient says she thinks therapy is helping some. Says she feels more relaxed in neck and shoulders.    Pertinent History Neck pain of insidious onset for about 1 year    Patient Stated Goals Learn what to do to help it    Currently in Pain? Yes    Pain Score 2     Pain Location Neck    Pain Orientation Right;Left    Pain Descriptors / Indicators Tightness    Pain Onset More than a month ago                             [facility] Adult PT Treatment/Exercise - 06/30/20 0001      Neck  Exercises: Seated   Cervical Isometrics Right lateral flexion;Left lateral flexion;5 secs;5 reps    Neck Retraction 20 reps;5 secs    Shoulder Rolls Backwards;10 reps;Forwards   x 10 each    Other Seated Exercise 3D cervical excursion x10 each; 3D thoracic excursion x 5 each     Other Seated Exercise scapular retraction 20 x5"       Manual Therapy   Manual Therapy Soft tissue mobilization;Manual Traction    Manual therapy comments Manual complete separate than rest of tx    Soft tissue mobilization Supine position: focus on cervical mm     Manual Traction Manual cervical traction and suboccipital release 4x 2' holds each      Neck Exercises: Stretches   Upper Trapezius Stretch Right;Left;3 reps;30 seconds                    PT Short Term Goals - 06/24/20 1618      PT SHORT TERM GOAL #1   Title Patient will report understanding and regular compliance  with HEP to improve mobility and decrease pain.    Time 3    Period Weeks    Status New    Target Date 07/15/20      PT SHORT TERM GOAL #2   Title Patient will report improvement in overall subjective complaint of at least 25% for improved QOL.    Time 3    Period Weeks    Status New    Target Date 07/15/20             PT Long Term Goals - 06/24/20 1619      PT LONG TERM GOAL #1   Title Patient will report improvement in overall subjective complaint of at least 50% for improved QOL.    Time 6    Period Weeks    Status New    Target Date 08/05/20      PT LONG TERM GOAL #2   Title Patient will demonstrate pain-free cervical AROM for improved ability to observe environment and perform daily activities.    Time 6    Period Weeks    Status New    Target Date 08/05/20      PT LONG TERM GOAL #3   Title Patient will demonstrate improvement on FOTO of at least 10% indicating improvement in overall functional mobility.    Time 6    Period Weeks    Status New    Target Date 08/05/20                  Plan - 06/30/20 1006    Clinical Impression Statement Patient tolerated session well today. Progressed cervical and scapular postural strengthening. Added cervical isometrics and upper trapezius stretching. Patient educated on proper form and function of each. Patient noted slight reduction in neck tension post treatment. Patient continues to have mild restriction in bilateral suboccipitals upon palpation with manual treatment. Patient educated on and issued updated HEP handout.    Personal Factors and Comorbidities Age;Time since onset of injury/illness/exacerbation;Comorbidity 2    Comorbidities Asthma; osteoporosis    Examination-Activity Limitations Lift;Sit    Examination-Participation Restrictions Meal Prep;Community Activity    Stability/Clinical Decision Making Stable/Uncomplicated    Rehab Potential Good    PT Frequency 2x / week    PT Duration 6 weeks    PT Treatment/Interventions Biofeedback;Cryotherapy;ADLs/Self Care Home Management;Electrical Stimulation;Moist Heat;Traction;DME Instruction;Gait training;Functional mobility training;Therapeutic activities;Therapeutic exercise;Balance training;Neuromuscular re-education;Patient/family education;Manual techniques;Passive range of motion;Dry needling;Energy conservation;Taping    PT Next Visit Plan Gentle traction for pain relief. Cervical AROM. Manual to cervical paraspinals. Continue to follow-up on sensory sensitivities.    PT Home Exercise Plan 8/12: Cervical retraction; 06/30/20: scap retraction, shoulder rolls, upper trap stretching    Consulted and Agree with Plan of Care Patient           Patient will benefit from skilled therapeutic intervention in order to improve the following deficits and impairments:  Pain, Improper body mechanics, Decreased mobility, Postural dysfunction, Decreased activity tolerance, Decreased endurance, Decreased range of motion, Hypomobility  Visit Diagnosis: Cervicalgia     Problem List Patient  Active Problem List   Diagnosis Date Noted  . Dyspnea on exertion 01/08/2018  . Moderate persistent asthma 10/13/2017  . Cough variant asthma  vs UACS 10/12/2017  . Nipple dermatitis 01/23/2017  . Annual physical exam 02/10/2015  . Routine gynecological examination 02/05/2014  . Gastroesophageal reflux disease   . Palpitations   . Chest pain   . Osteoporosis   . Anxiety  10:10 AM, 06/30/20 Josue Hector PT DPT  Physical Therapist with [facility]  ([phone]) [phone] [place] [address] [place], [place], [address] Phone: [phone]   Fax:  [phone]  Name: Mckenzie Collins MRN: [id number] Date of Birth: [birth year]

## 2020-06-30 NOTE — Patient Instructions (Signed)
Access Code: [email or web address] URL: [email or web address] Date: 06/30/2020 Prepared by: Josue Hector  Program Notes Shoulder rolls x20 forward, x20 backward   Exercises Seated Scapular Retraction - 2-3 x daily - 7 x weekly - 2 sets - 10 reps - 5 sec hold Seated Shoulder Rolls - 2-3 x daily - 7 x weekly - 2 sets - 10 reps Seated Upper Trapezius Stretch - 2-3 x daily - 7 x weekly - 1 sets - 3 reps - 30 sec hold

## 2020-07-08 ENCOUNTER — Encounter (HOSPITAL_COMMUNITY): Payer: Self-pay

## 2020-07-08 ENCOUNTER — Other Ambulatory Visit: Payer: Self-pay

## 2020-07-08 ENCOUNTER — Ambulatory Visit (HOSPITAL_COMMUNITY): Payer: Medicare Other

## 2020-07-08 DIAGNOSIS — M542 Cervicalgia: Secondary | ICD-10-CM

## 2020-07-08 NOTE — Therapy (Signed)
[name], [place], [address] Phone: [phone]   Fax:  [phone]  Physical Therapy Treatment  Patient Details  Name: Mckenzie Collins MRN: [id number] Date of Birth: 05/06/1941 Referring Provider (PT): Elwin Sleight, DO   Encounter Date: 07/08/2020   PT End of Session - 07/08/20 1012    Visit Number 4    Number of Visits 12    Date for PT Re-Evaluation 08/05/20    Authorization Type [employer] Medicare    Progress Note Due on Visit 10    PT Start Time 1002    PT Stop Time 1042    PT Time Calculation (min) 40 min    Activity Tolerance Patient tolerated treatment well    Behavior During Therapy [facility] for tasks assessed/performed           Past Medical History:  Diagnosis Date   Asthma    In childhood   Breast mass left   skin lesion around 11-12 x 2 -3 weeks   Chest pain    Dyspnea 2005   Negative stress nuclear in 2005; normal echocardiogram   Gastroesophageal reflux disease    hiatal hernia; gastritis; dysphagia   Hemorrhoids    Inverted nipple left   always   Low back pain    Microscopic hematuria    Osteoporosis    Palpitations 2003   few short runs of supraventricular tachycardia on Holter    Past Surgical History:  Procedure Laterality Date   COLONOSCOPY  2011   TONSILLECTOMY      There were no vitals filed for this visit.   Subjective Assessment - 07/08/20 1003    Subjective Pt stated her neck is feeling really tight, pain scale 2/10 today.  Reports she does have a lot of pressure and sometimes pain in her ears.    Pertinent History Neck pain of insidious onset for about 1 year    Patient Stated Goals Learn what to do to help it    Currently in Pain? Yes    Pain Score 2     Pain Location Neck    Pain Orientation Right;Left;Posterior;Proximal    Pain Descriptors / Indicators Tightness    Pain Type Chronic pain    Pain Radiating Towards Up towards her head    Pain Onset  More than a month ago    Pain Frequency Intermittent    Aggravating Factors  reading, forward leaning    Pain Relieving Factors No pain at night, laying down                             [facility] Adult PT Treatment/Exercise - 07/08/20 0001      Posture/Postural Control   Posture/Postural Control Postural limitations    Postural Limitations Forward head      Exercises   Exercises Neck      Neck Exercises: Standing   Neck Retraction 10 reps;3 secs    Neck Retraction Limitations towel behind head    Other Standing Exercises Wall arch 10x      Neck Exercises: Seated   Neck Retraction 10 reps;5 secs    Neck Retraction Limitations 2 sets, min cueing to reduce leaning back    Shoulder Rolls Backwards    Shoulder Rolls Limitations up, back and down    Other Seated Exercise Deep breathing 10 deep breathes; 3D cervical excursion x10 each; 3D thoracic excursion x 5 each  Other Seated Exercise rows with RTB 155"       Manual Therapy   Manual Therapy Soft tissue mobilization;Manual Traction    Manual therapy comments Manual complete separate than rest of tx    Soft tissue mobilization Supine position: focus on cervical mm     Manual Traction Manual cervical traction and suboccipital release 4x 2' holds each      Neck Exercises: Stretches   Levator Stretch 2 reps;30 seconds;Right;Left                    PT Short Term Goals - 06/24/20 1618      PT SHORT TERM GOAL #1   Title Patient will report understanding and regular compliance with HEP to improve mobility and decrease pain.    Time 3    Period Weeks    Status New    Target Date 07/15/20      PT SHORT TERM GOAL #2   Title Patient will report improvement in overall subjective complaint of at least 25% for improved QOL.    Time 3    Period Weeks    Status New    Target Date 07/15/20             PT Long Term Goals - 06/24/20 1619      PT LONG TERM GOAL #1   Title Patient will report  improvement in overall subjective complaint of at least 50% for improved QOL.    Time 6    Period Weeks    Status New    Target Date 08/05/20      PT LONG TERM GOAL #2   Title Patient will demonstrate pain-free cervical AROM for improved ability to observe environment and perform daily activities.    Time 6    Period Weeks    Status New    Target Date 08/05/20      PT LONG TERM GOAL #3   Title Patient will demonstrate improvement on FOTO of at least 10% indicating improvement in overall functional mobility.    Time 6    Period Weeks    Status New    Target Date 08/05/20                 Plan - 07/08/20 1027    Clinical Impression Statement Progressed postural strengthening with additional theraband rows and standing cervical and thoracic retraction exercises.  Pt able to complete all exercises with good form following initial exercise.  Continued mobility exercises and added levator stretch with reports of relief following. EOS with manual STM to cervical paraspinals, upper traps and posterior cervical mm, continues to have restrictions in subocciptial region.    Personal Factors and Comorbidities Age;Time since onset of injury/illness/exacerbation;Comorbidity 2    Comorbidities Asthma; osteoporosis    Examination-Activity Limitations Lift;Sit    Examination-Participation Restrictions Meal Prep;Community Activity    Stability/Clinical Decision Making Stable/Uncomplicated    Clinical Decision Making Low    Rehab Potential Good    PT Frequency 2x / week    PT Duration 6 weeks    PT Treatment/Interventions Biofeedback;Cryotherapy;ADLs/Self Care Home Management;Electrical Stimulation;Moist Heat;Traction;DME Instruction;Gait training;Functional mobility training;Therapeutic activities;Therapeutic exercise;Balance training;Neuromuscular re-education;Patient/family education;Manual techniques;Passive range of motion;Dry needling;Energy conservation;Taping    PT Next Visit Plan Gentle  traction for pain relief. Cervical AROM. Manual to cervical paraspinals. Continue to follow-up on sensory sensitivities.    PT Home Exercise Plan 8/12: Cervical retraction; 06/30/20: scap retraction, shoulder rolls, upper trap stretching  Patient will benefit from skilled therapeutic intervention in order to improve the following deficits and impairments:  Pain, Improper body mechanics, Decreased mobility, Postural dysfunction, Decreased activity tolerance, Decreased endurance, Decreased range of motion, Hypomobility  Visit Diagnosis: Cervicalgia     Problem List Patient Active Problem List   Diagnosis Date Noted   Dyspnea on exertion 01/08/2018   Moderate persistent asthma 10/13/2017   Cough variant asthma  vs UACS 10/12/2017   Nipple dermatitis 01/23/2017   Annual physical exam 02/10/2015   Routine gynecological examination 02/05/2014   Gastroesophageal reflux disease    Palpitations    Chest pain    Osteoporosis    Anxiety    Ihor Austin, LPTA/CLT; CBIS [phone]  Aldona Lento 07/08/2020, 11:06 AM  [place], [place], [address] Phone: [phone]   Fax:  [phone]  Name: Mckenzie Collins MRN: [id number] Date of Birth: [birth year]

## 2020-07-14 ENCOUNTER — Other Ambulatory Visit: Payer: Self-pay

## 2020-07-14 ENCOUNTER — Encounter (HOSPITAL_COMMUNITY): Payer: Self-pay

## 2020-07-14 ENCOUNTER — Encounter (HOSPITAL_COMMUNITY)
Admission: RE | Admit: 2020-07-14 | Discharge: 2020-07-14 | Disposition: A | Discharge status: Home / Self Care | Payer: Medicare Other | Attending: Internal Medicine | Admitting: Internal Medicine

## 2020-07-14 ENCOUNTER — Ambulatory Visit (HOSPITAL_COMMUNITY): Payer: Medicare Other | Attending: Neurological Surgery

## 2020-07-14 DIAGNOSIS — M810 Age-related osteoporosis without current pathological fracture: Secondary | ICD-10-CM | POA: Diagnosis not present

## 2020-07-14 DIAGNOSIS — K219 Gastro-esophageal reflux disease without esophagitis: Secondary | ICD-10-CM | POA: Diagnosis not present

## 2020-07-14 DIAGNOSIS — M542 Cervicalgia: Secondary | ICD-10-CM

## 2020-07-14 MED ORDER — DENOSUMAB 60 MG/ML ~~LOC~~ SOSY
PREFILLED_SYRINGE | SUBCUTANEOUS | Status: AC
  Filled 2020-07-14: qty 1

## 2020-07-14 MED ORDER — DENOSUMAB 60 MG/ML ~~LOC~~ SOSY
60.0000 mg | PREFILLED_SYRINGE | Freq: Once | SUBCUTANEOUS | Status: AC
  Administered 2020-07-14: 60 mg via SUBCUTANEOUS

## 2020-07-14 NOTE — Therapy (Signed)
[place], [place], [address] Phone: [phone]   Fax:  [phone]  Physical Therapy Treatment  Patient Details  Name: Mckenzie Collins MRN: [id number] Date of Birth: 03/03/1941 Referring Provider (PT): Elwin Sleight, DO   Encounter Date: 07/14/2020   PT End of Session - 07/14/20 1457    Visit Number 5    Number of Visits 12    Date for PT Re-Evaluation 08/05/20    Authorization Type UHC Medicare    Progress Note Due on Visit 10    PT Start Time 1450    PT Stop Time 1528    PT Time Calculation (min) 38 min    Activity Tolerance Patient tolerated treatment well    Behavior During Therapy [facility] for tasks assessed/performed           Past Medical History:  Diagnosis Date  . Asthma    In childhood  . Breast mass left   skin lesion around 11-12 x 2 -3 weeks  . Chest pain   . Dyspnea 2005   Negative stress nuclear in 2005; normal echocardiogram  . Gastroesophageal reflux disease    hiatal hernia; gastritis; dysphagia  . Hemorrhoids   . Inverted nipple left   always  . Low back pain   . Microscopic hematuria   . Osteoporosis   . Palpitations 2003   few short runs of supraventricular tachycardia on Holter    Past Surgical History:  Procedure Laterality Date  . COLONOSCOPY  2011  . TONSILLECTOMY      There were no vitals filed for this visit.   Subjective Assessment - 07/14/20 1454    Subjective Pt stated her neck is feeling good today, no reports of pain.  Continues to have tightness daily depending upon the time of the day.    Pertinent History Neck pain of insidious onset for about 1 year    Patient Stated Goals Learn what to do to help it    Currently in Pain? No/denies                             [facility] Adult PT Treatment/Exercise - 07/14/20 0001      Posture/Postural Control   Posture/Postural Control Postural limitations    Postural Limitations Forward head       Exercises   Exercises Neck      Neck Exercises: Theraband   Shoulder Extension 10 reps;Red    Rows 15 reps;Red      Neck Exercises: Standing   Neck Retraction 10 reps;3 secs    Neck Retraction Limitations towel behind head    Other Standing Exercises Wall arch 10x      Neck Exercises: Seated   Other Seated Exercise Cervical mobility diagonals (smell arm pit then look over shoulder)      Neck Exercises: Prone   Neck Retraction 10 reps;3 secs    Neck Retraction Limitations chin tuck      Manual Therapy   Manual Therapy Soft tissue mobilization;Manual Traction    Manual therapy comments Manual complete separate than rest of tx    Soft tissue mobilization Supine position: focus on cervical mm     Manual Traction Manual cervical traction and suboccipital release 4x 2' holds each                    PT Short Term Goals - 06/24/20 8016  PT SHORT TERM GOAL #1   Title Patient will report understanding and regular compliance with HEP to improve mobility and decrease pain.    Time 3    Period Weeks    Status New    Target Date 07/15/20      PT SHORT TERM GOAL #2   Title Patient will report improvement in overall subjective complaint of at least 25% for improved QOL.    Time 3    Period Weeks    Status New    Target Date 07/15/20             PT Long Term Goals - 06/24/20 1619      PT LONG TERM GOAL #1   Title Patient will report improvement in overall subjective complaint of at least 50% for improved QOL.    Time 6    Period Weeks    Status New    Target Date 08/05/20      PT LONG TERM GOAL #2   Title Patient will demonstrate pain-free cervical AROM for improved ability to observe environment and perform daily activities.    Time 6    Period Weeks    Status New    Target Date 08/05/20      PT LONG TERM GOAL #3   Title Patient will demonstrate improvement on FOTO of at least 10% indicating improvement in overall functional mobility.    Time 6     Period Weeks    Status New    Target Date 08/05/20                 Plan - 07/14/20 1508    Clinical Impression Statement Reviewed current HEP.  Session focus with cervical mobility and education for importance of posture for pain control.  Progressed to standing theraband exercises with cueing to reduce shoulder elevation with exericse.  Also added prone cervical retraction with verbal and tactile cueing.  EOS with manual STM with improved cervical mobility noted and reduced overall tightness in cervical paraspinals, UT and posterior cervical mm.  Does continues to have some tightness in suboccipital region though feel it is reduced as well.  No reoprts of pain through session.    Personal Factors and Comorbidities Age;Time since onset of injury/illness/exacerbation;Comorbidity 2    Comorbidities Asthma; osteoporosis    Examination-Activity Limitations Lift;Sit    Examination-Participation Restrictions Meal Prep;Community Activity    Stability/Clinical Decision Making Stable/Uncomplicated    Clinical Decision Making Low    Rehab Potential Good    PT Frequency 2x / week    PT Duration 6 weeks    PT Treatment/Interventions Biofeedback;Cryotherapy;ADLs/Self Care Home Management;Electrical Stimulation;Moist Heat;Traction;DME Instruction;Gait training;Functional mobility training;Therapeutic activities;Therapeutic exercise;Balance training;Neuromuscular re-education;Patient/family education;Manual techniques;Passive range of motion;Dry needling;Energy conservation;Taping    PT Next Visit Plan Continue cervical mobility and progress postural strengthening.  Manual to suboccipital and gentle traction.    PT Home Exercise Plan 8/12: Cervical retraction; 06/30/20: scap retraction, shoulder rolls, upper trap stretching           Patient will benefit from skilled therapeutic intervention in order to improve the following deficits and impairments:  Pain, Improper body mechanics, Decreased  mobility, Postural dysfunction, Decreased activity tolerance, Decreased endurance, Decreased range of motion, Hypomobility  Visit Diagnosis: Cervicalgia     Problem List Patient Active Problem List   Diagnosis Date Noted  . Dyspnea on exertion 01/08/2018  . Moderate persistent asthma 10/13/2017  . Cough variant asthma  vs UACS 10/12/2017  . Nipple dermatitis  01/23/2017  . Annual physical exam 02/10/2015  . Routine gynecological examination 02/05/2014  . Gastroesophageal reflux disease   . Palpitations   . Chest pain   . Osteoporosis   . Anxiety    Ihor Austin, LPTA/CLT; [place]  Aldona Lento 07/14/2020, 3:29 PM  [place] [address] [place], [place], [address] Phone: [phone]   Fax:  [phone]  Name: Mckenzie Collins MRN: [id number] Date of Birth: 1941-11-05

## 2020-07-16 ENCOUNTER — Encounter (HOSPITAL_COMMUNITY): Payer: Self-pay | Admitting: Physical Therapy

## 2020-07-16 ENCOUNTER — Other Ambulatory Visit: Payer: Self-pay

## 2020-07-16 ENCOUNTER — Ambulatory Visit (HOSPITAL_COMMUNITY): Payer: Medicare Other | Admitting: Physical Therapy

## 2020-07-16 DIAGNOSIS — M542 Cervicalgia: Secondary | ICD-10-CM | POA: Diagnosis not present

## 2020-07-16 NOTE — Therapy (Signed)
[place], [place], [address] Phone: [phone]   Fax:  [phone]  Physical Therapy Treatment  Patient Details  Name: Mckenzie Collins MRN: [id number] Date of Birth: 06/21/1941 Referring Provider (PT): Elwin Sleight, DO   Encounter Date: 07/16/2020   PT End of Session - 07/16/20 1443    Visit Number 6    Number of Visits 12    Date for PT Re-Evaluation 08/05/20    Authorization Type [employer] Medicare    Progress Note Due on Visit 10    PT Start Time 1445    PT Stop Time 1525    PT Time Calculation (min) 40 min    Activity Tolerance Patient tolerated treatment well    Behavior During Therapy [facility] for tasks assessed/performed           Past Medical History:  Diagnosis Date  . Asthma    In childhood  . Breast mass left   skin lesion around 11-12 x 2 -3 weeks  . Chest pain   . Dyspnea 2005   Negative stress nuclear in 2005; normal echocardiogram  . Gastroesophageal reflux disease    hiatal hernia; gastritis; dysphagia  . Hemorrhoids   . Inverted nipple left   always  . Low back pain   . Microscopic hematuria   . Osteoporosis   . Palpitations 2003   few short runs of supraventricular tachycardia on Holter    Past Surgical History:  Procedure Laterality Date  . COLONOSCOPY  2011  . TONSILLECTOMY      There were no vitals filed for this visit.   Subjective Assessment - 07/16/20 1447    Subjective States that her neck feels pretty good it has minor pain/stiffness in the back of her neck. Just tired overall she has done a lot of yard work and walking today.    Pertinent History Neck pain of insidious onset for about 1 year    Patient Stated Goals Learn what to do to help it    Currently in Pain? Yes    Pain Score 1     Pain Location Neck    Pain Orientation Right;Left    Pain Descriptors / Indicators Tightness    Pain Type Chronic pain                             [facility] Adult  PT Treatment/Exercise - 07/16/20 0001      Neck Exercises: Standing   Other Standing Exercises wall w's - verbal and tactile cues 5x5 5" holds      Neck Exercises: Seated   Neck Retraction 5 reps   with rotation x3 sets B    Other Seated Exercise SNAGGS extension 2x10 5" holds, SNAGGs ROT 3x5 5" holds B       Neck Exercises: Supine   Other Supine Exercise serratus punchs - focus on keeping head down x5 10" holds ---> then with chin tuck x5 10" hlds    Other Supine Exercise chin tucks - "bobble heads" with PT assist x15 then 15 independently 5" holds in retraction ; chin tucks with rotation - 3x5 5" holds.       Manual Therapy   Manual Therapy Soft tissue mobilization;Manual Traction    Manual therapy comments Manual complete separate than rest of tx    Soft tissue mobilization STM to bilateral SCM - tolerated moderately well    Manual Traction Cervical traction  supine - 10-30" holds - 5 minutes                  PT Education - 07/16/20 1516    Education Details educated patient in posture, anatomy and how to perform exercises. HEP    Person(s) Educated Patient    Methods Explanation    Comprehension Verbalized understanding            PT Short Term Goals - 06/24/20 1618      PT SHORT TERM GOAL #1   Title Patient will report understanding and regular compliance with HEP to improve mobility and decrease pain.    Time 3    Period Weeks    Status New    Target Date 07/15/20      PT SHORT TERM GOAL #2   Title Patient will report improvement in overall subjective complaint of at least 25% for improved QOL.    Time 3    Period Weeks    Status New    Target Date 07/15/20             PT Long Term Goals - 06/24/20 1619      PT LONG TERM GOAL #1   Title Patient will report improvement in overall subjective complaint of at least 50% for improved QOL.    Time 6    Period Weeks    Status New    Target Date 08/05/20      PT LONG TERM GOAL #2   Title Patient will  demonstrate pain-free cervical AROM for improved ability to observe environment and perform daily activities.    Time 6    Period Weeks    Status New    Target Date 08/05/20      PT LONG TERM GOAL #3   Title Patient will demonstrate improvement on FOTO of at least 10% indicating improvement in overall functional mobility.    Time 6    Period Weeks    Status New    Target Date 08/05/20                 Plan - 07/16/20 1444    Clinical Impression Statement Focused on education, posture and neck ROM with chin tucks. Reduced tightness noted with manual and exercises. Discussed and educated patient on importance of posture and difference with upper cervical and lower cervical ROM. Added new exercises to HEP. Fatigue noted end of session but no tightness. Patient reported reduced tightness end of session. At end of session patient noted hypersensitivity to sounds, will assess referred pain and jaw next session rule out TMJ issue.    Personal Factors and Comorbidities Age;Time since onset of injury/illness/exacerbation;Comorbidity 2    Comorbidities Asthma; osteoporosis    Examination-Activity Limitations Lift;Sit    Examination-Participation Restrictions Meal Prep;Community Activity    Stability/Clinical Decision Making Stable/Uncomplicated    Rehab Potential Good    PT Frequency 2x / week    PT Duration 6 weeks    PT Treatment/Interventions Biofeedback;Cryotherapy;ADLs/Self Care Home Management;Electrical Stimulation;Moist Heat;Traction;DME Instruction;Gait training;Functional mobility training;Therapeutic activities;Therapeutic exercise;Balance training;Neuromuscular re-education;Patient/family education;Manual techniques;Passive range of motion;Dry needling;Energy conservation;Taping    PT Next Visit Plan Assess TMJ for referred pain (STM), Continue cervical mobility and progress postural strengthening.  Manual to suboccipital and gentle traction.    PT Home Exercise Plan 8/12: Cervical  retraction; 06/30/20: scap retraction, shoulder rolls, upper trap stretching; 9/3 w's at wall, serratus protraction, SNAGGs, cervical ROT  Patient will benefit from skilled therapeutic intervention in order to improve the following deficits and impairments:  Pain, Improper body mechanics, Decreased mobility, Postural dysfunction, Decreased activity tolerance, Decreased endurance, Decreased range of motion, Hypomobility  Visit Diagnosis: Cervicalgia     Problem List Patient Active Problem List   Diagnosis Date Noted  . Dyspnea on exertion 01/08/2018  . Moderate persistent asthma 10/13/2017  . Cough variant asthma  vs UACS 10/12/2017  . Nipple dermatitis 01/23/2017  . Annual physical exam 02/10/2015  . Routine gynecological examination 02/05/2014  . Gastroesophageal reflux disease   . Palpitations   . Chest pain   . Osteoporosis   . Anxiety    3:28 PM, 07/16/20 Jerene Pitch, DPT Physical Therapy with [facility]  [phone] office  [place] [address] [place], [place], [address] Phone: [phone]   Fax:  [phone]  Name: Nirvi Boehler MRN: [id number] Date of Birth: Apr 27, 1941

## 2020-07-21 ENCOUNTER — Encounter (HOSPITAL_COMMUNITY): Payer: Self-pay | Admitting: Physical Therapy

## 2020-07-21 ENCOUNTER — Other Ambulatory Visit: Payer: Self-pay

## 2020-07-21 ENCOUNTER — Ambulatory Visit (HOSPITAL_COMMUNITY): Payer: Medicare Other | Admitting: Physical Therapy

## 2020-07-21 DIAGNOSIS — M542 Cervicalgia: Secondary | ICD-10-CM | POA: Diagnosis not present

## 2020-07-21 NOTE — Therapy (Signed)
[place], [place], [address] Phone: [phone]   Fax:  [phone]  Physical Therapy Treatment  Patient Details  Name: Mckenzie Collins MRN: [id number] Date of Birth: September 16, 1941 Referring Provider (PT): Elwin Sleight, DO   Encounter Date: 07/21/2020   PT End of Session - 07/21/20 1441    Visit Number 7    Number of Visits 12    Date for PT Re-Evaluation 08/05/20    Authorization Type [employer] Medicare    Progress Note Due on Visit 10    PT Start Time 1440    PT Stop Time 1518    PT Time Calculation (min) 38 min    Activity Tolerance Patient tolerated treatment well    Behavior During Therapy [facility] for tasks assessed/performed           Past Medical History:  Diagnosis Date  . Asthma    In childhood  . Breast mass left   skin lesion around 11-12 x 2 -3 weeks  . Chest pain   . Dyspnea 2005   Negative stress nuclear in 2005; normal echocardiogram  . Gastroesophageal reflux disease    hiatal hernia; gastritis; dysphagia  . Hemorrhoids   . Inverted nipple left   always  . Low back pain   . Microscopic hematuria   . Osteoporosis   . Palpitations 2003   few short runs of supraventricular tachycardia on Holter    Past Surgical History:  Procedure Laterality Date  . COLONOSCOPY  2011  . TONSILLECTOMY      There were no vitals filed for this visit.   Subjective Assessment - 07/21/20 1441    Subjective Patient reported that she feels like therapy has been helping. She reports that she does not have any pain in her neck currently.    Pertinent History Neck pain of insidious onset for about 1 year    Patient Stated Goals Learn what to do to help it    Currently in Pain? No/denies              [facility] PT Assessment - 07/21/20 0001      Observation/Other Assessments   Observations Decreased mouth opening by visual assessment. Slight mandibular deviation to the right side with opening.       Palpation    Palpation comment No tenderness through temporalis or massetter muscles on either side.                          [facility] Adult PT Treatment/Exercise - 07/21/20 0001      Neck Exercises: Standing   Other Standing Exercises wall w's - verbal and tactile cues 5x5 5" holds      Neck Exercises: Seated   Other Seated Exercise SNAGGS extension 2x10 5" holds, SNAGGs ROT 3x5 5" holds B     Other Seated Exercise Mandibular deviation stretch towards the left side      Manual Therapy   Manual Therapy Soft tissue mobilization;Manual Traction    Manual therapy comments Manual complete separate than rest of tx    Soft tissue mobilization STM to bilateral SCM - tolerated moderately well    Manual Traction Cervical traction supine - 10-30" holds - 5 minutes                  PT Education - 07/21/20 1702    Education Details Educated patient on TMJ examination findings and performing stretch at home.  Person(s) Educated Patient    Methods Explanation    Comprehension Verbalized understanding            PT Short Term Goals - 06/24/20 1618      PT SHORT TERM GOAL #1   Title Patient will report understanding and regular compliance with HEP to improve mobility and decrease pain.    Time 3    Period Weeks    Status New    Target Date 07/15/20      PT SHORT TERM GOAL #2   Title Patient will report improvement in overall subjective complaint of at least 25% for improved QOL.    Time 3    Period Weeks    Status New    Target Date 07/15/20             PT Long Term Goals - 06/24/20 1619      PT LONG TERM GOAL #1   Title Patient will report improvement in overall subjective complaint of at least 50% for improved QOL.    Time 6    Period Weeks    Status New    Target Date 08/05/20      PT LONG TERM GOAL #2   Title Patient will demonstrate pain-free cervical AROM for improved ability to observe environment and perform daily activities.    Time 6    Period  Weeks    Status New    Target Date 08/05/20      PT LONG TERM GOAL #3   Title Patient will demonstrate improvement on FOTO of at least 10% indicating improvement in overall functional mobility.    Time 6    Period Weeks    Status New    Target Date 08/05/20                 Plan - 07/21/20 1702    Clinical Impression Statement Began by assessing patient's TMJ and related muscles. Patient without any tenderness through temporalis or masseter muscles. Patient with some noted decreased jaw opening as well as some slight deviation to the right side with opening. Educated on TMJ deviation stretch which patient tolerated well and which therapist educated patient to perform at home. Ended with manual therapy with patient reporting that cervical distraction felt good.    Personal Factors and Comorbidities Age;Time since onset of injury/illness/exacerbation;Comorbidity 2    Comorbidities Asthma; osteoporosis    Examination-Activity Limitations Lift;Sit    Examination-Participation Restrictions Meal Prep;Community Activity    Stability/Clinical Decision Making Stable/Uncomplicated    Rehab Potential Good    PT Frequency 2x / week    PT Duration 6 weeks    PT Treatment/Interventions Biofeedback;Cryotherapy;ADLs/Self Care Home Management;Electrical Stimulation;Moist Heat;Traction;DME Instruction;Gait training;Functional mobility training;Therapeutic activities;Therapeutic exercise;Balance training;Neuromuscular re-education;Patient/family education;Manual techniques;Passive range of motion;Dry needling;Energy conservation;Taping    PT Next Visit Plan F/U on TMJ stretch, Continue cervical mobility and progress postural strengthening.  Manual to suboccipital and gentle traction.    PT Home Exercise Plan 8/12: Cervical retraction; 06/30/20: scap retraction, shoulder rolls, upper trap stretching; 9/3 w's at wall, serratus protraction, SNAGGs, cervical ROT; 07/21/20: TMJ deviation stretch to the LT  10x10''           Patient will benefit from skilled therapeutic intervention in order to improve the following deficits and impairments:  Pain, Improper body mechanics, Decreased mobility, Postural dysfunction, Decreased activity tolerance, Decreased endurance, Decreased range of motion, Hypomobility  Visit Diagnosis: Cervicalgia     Problem List Patient Active Problem List  Diagnosis Date Noted  . Dyspnea on exertion 01/08/2018  . Moderate persistent asthma 10/13/2017  . Cough variant asthma  vs UACS 10/12/2017  . Nipple dermatitis 01/23/2017  . Annual physical exam 02/10/2015  . Routine gynecological examination 02/05/2014  . Gastroesophageal reflux disease   . Palpitations   . Chest pain   . Osteoporosis   . Anxiety    Clarene Critchley PT, DPT 5:06 PM, 07/21/20 [place], [place], [address] Phone: [phone]   Fax:  [phone]  Name: Mckenzie Collins MRN: [id number] Date of Birth: 30-Dec-1940

## 2020-07-23 ENCOUNTER — Ambulatory Visit (HOSPITAL_COMMUNITY): Payer: Medicare Other | Admitting: Physical Therapy

## 2020-07-23 ENCOUNTER — Encounter (HOSPITAL_COMMUNITY): Payer: Self-pay | Admitting: Physical Therapy

## 2020-07-23 ENCOUNTER — Other Ambulatory Visit: Payer: Self-pay

## 2020-07-23 DIAGNOSIS — M542 Cervicalgia: Secondary | ICD-10-CM

## 2020-07-23 NOTE — Therapy (Signed)
[place], [place], [address] Phone: [phone]   Fax:  [phone]  Physical Therapy Treatment  Patient Details  Name: Mckenzie Collins MRN: [id number] Date of Birth: Dec 18, 1940 Referring Provider (PT): Elwin Sleight, DO   Encounter Date: 07/23/2020   PT End of Session - 07/23/20 1436    Visit Number 8    Number of Visits 12    Date for PT Re-Evaluation 08/05/20    Authorization Type [employer] Medicare    Progress Note Due on Visit 10    PT Start Time 1435    PT Stop Time 1513    PT Time Calculation (min) 38 min    Activity Tolerance Patient tolerated treatment well    Behavior During Therapy [facility] for tasks assessed/performed           Past Medical History:  Diagnosis Date  . Asthma    In childhood  . Breast mass left   skin lesion around 11-12 x 2 -3 weeks  . Chest pain   . Dyspnea 2005   Negative stress nuclear in 2005; normal echocardiogram  . Gastroesophageal reflux disease    hiatal hernia; gastritis; dysphagia  . Hemorrhoids   . Inverted nipple left   always  . Low back pain   . Microscopic hematuria   . Osteoporosis   . Palpitations 2003   few short runs of supraventricular tachycardia on Holter    Past Surgical History:  Procedure Laterality Date  . COLONOSCOPY  2011  . TONSILLECTOMY      There were no vitals filed for this visit.   Subjective Assessment - 07/23/20 1435    Subjective Patient reported that she is not having pain currently. She reported she has been able to try her exercises some, but not much.    Pertinent History Neck pain of insidious onset for about 1 year    Patient Stated Goals Learn what to do to help it    Currently in Pain? No/denies                             [facility] Adult PT Treatment/Exercise - 07/23/20 0001      Neck Exercises: Standing   Other Standing Exercises wall w's with RTB - verbal and tactile cues 10x 5" holds      Neck  Exercises: Seated   Neck Retraction 15 reps    Other Seated Exercise SNAGGS extension 2x10 5" holds, SNAGGs ROT 3x5 5" holds B     Other Seated Exercise Mandibular deviation stretch towards the left side x10      Manual Therapy   Manual Therapy Soft tissue mobilization;Manual Traction    Manual therapy comments Manual complete separate than rest of tx    Soft tissue mobilization STM to bilateral SCM - tolerated moderately well    Manual Traction Cervical traction supine - 10-30" holds - 5 minutes                    PT Short Term Goals - 06/24/20 1618      PT SHORT TERM GOAL #1   Title Patient will report understanding and regular compliance with HEP to improve mobility and decrease pain.    Time 3    Period Weeks    Status New    Target Date 07/15/20      PT SHORT TERM GOAL #2   Title Patient will  report improvement in overall subjective complaint of at least 25% for improved QOL.    Time 3    Period Weeks    Status New    Target Date 07/15/20             PT Long Term Goals - 06/24/20 1619      PT LONG TERM GOAL #1   Title Patient will report improvement in overall subjective complaint of at least 50% for improved QOL.    Time 6    Period Weeks    Status New    Target Date 08/05/20      PT LONG TERM GOAL #2   Title Patient will demonstrate pain-free cervical AROM for improved ability to observe environment and perform daily activities.    Time 6    Period Weeks    Status New    Target Date 08/05/20      PT LONG TERM GOAL #3   Title Patient will demonstrate improvement on FOTO of at least 10% indicating improvement in overall functional mobility.    Time 6    Period Weeks    Status New    Target Date 08/05/20                 Plan - 07/23/20 1521    Clinical Impression Statement Focused on cervical mobility and strengthening this session. Patient reported that she has not been able to do jaw stretch as much so incorporated that into this  session. Added resistance with W exercise at the wall this session, patient required cueing to improve neck alignment and therapist provided visual cue of where patient should look to reduce neck extension with the exercise. Patient reported feeling good following manual therapy.    Personal Factors and Comorbidities Age;Time since onset of injury/illness/exacerbation;Comorbidity 2    Comorbidities Asthma; osteoporosis    Examination-Activity Limitations Lift;Sit    Examination-Participation Restrictions Meal Prep;Community Activity    Stability/Clinical Decision Making Stable/Uncomplicated    Rehab Potential Good    PT Frequency 2x / week    PT Duration 6 weeks    PT Treatment/Interventions Biofeedback;Cryotherapy;ADLs/Self Care Home Management;Electrical Stimulation;Moist Heat;Traction;DME Instruction;Gait training;Functional mobility training;Therapeutic activities;Therapeutic exercise;Balance training;Neuromuscular re-education;Patient/family education;Manual techniques;Passive range of motion;Dry needling;Energy conservation;Taping    PT Next Visit Plan F/U on TMJ stretch, Continue cervical mobility and progress postural strengthening.  Manual to suboccipital and gentle traction.    PT Home Exercise Plan 8/12: Cervical retraction; 06/30/20: scap retraction, shoulder rolls, upper trap stretching; 9/3 w's at wall, serratus protraction, SNAGGs, cervical ROT; 07/21/20: TMJ deviation stretch to the LT 10x10''           Patient will benefit from skilled therapeutic intervention in order to improve the following deficits and impairments:  Pain, Improper body mechanics, Decreased mobility, Postural dysfunction, Decreased activity tolerance, Decreased endurance, Decreased range of motion, Hypomobility  Visit Diagnosis: Cervicalgia     Problem List Patient Active Problem List   Diagnosis Date Noted  . Dyspnea on exertion 01/08/2018  . Moderate persistent asthma 10/13/2017  . Cough variant  asthma  vs UACS 10/12/2017  . Nipple dermatitis 01/23/2017  . Annual physical exam 02/10/2015  . Routine gynecological examination 02/05/2014  . Gastroesophageal reflux disease   . Palpitations   . Chest pain   . Osteoporosis   . Anxiety    Clarene Critchley PT, DPT 3:22 PM, 07/23/20 [place], [place], [address] Phone: [phone]  Fax:  [phone]  Name: Chemere Steffler MRN: [id number] Date of Birth: [birth year]

## 2020-07-28 ENCOUNTER — Other Ambulatory Visit: Payer: Self-pay

## 2020-07-28 ENCOUNTER — Ambulatory Visit (HOSPITAL_COMMUNITY): Payer: Medicare Other | Admitting: Physical Therapy

## 2020-07-28 ENCOUNTER — Encounter (HOSPITAL_COMMUNITY): Payer: Self-pay | Admitting: Physical Therapy

## 2020-07-28 DIAGNOSIS — M542 Cervicalgia: Secondary | ICD-10-CM | POA: Diagnosis not present

## 2020-07-28 NOTE — Therapy (Signed)
[place], [place], [address] Phone: [phone]   Fax:  [phone]  Physical Therapy Treatment  Patient Details  Name: Mckenzie Collins MRN: [id number] Date of Birth: 1941/10/25 Referring Provider (PT): Elwin Sleight, DO   Encounter Date: 07/28/2020   PT End of Session - 07/28/20 1633    Visit Number 9    Number of Visits 12    Date for PT Re-Evaluation 08/05/20    Authorization Type [employer] Medicare    Progress Note Due on Visit 10    PT Start Time 1436    PT Stop Time 1515    PT Time Calculation (min) 39 min    Activity Tolerance Patient tolerated treatment well    Behavior During Therapy [facility] for tasks assessed/performed           Past Medical History:  Diagnosis Date  . Asthma    In childhood  . Breast mass left   skin lesion around 11-12 x 2 -3 weeks  . Chest pain   . Dyspnea 2005   Negative stress nuclear in 2005; normal echocardiogram  . Gastroesophageal reflux disease    hiatal hernia; gastritis; dysphagia  . Hemorrhoids   . Inverted nipple left   always  . Low back pain   . Microscopic hematuria   . Osteoporosis   . Palpitations 2003   few short runs of supraventricular tachycardia on Holter    Past Surgical History:  Procedure Laterality Date  . COLONOSCOPY  2011  . TONSILLECTOMY      There were no vitals filed for this visit.   Subjective Assessment - 07/28/20 1439    Subjective Patient reported she had some pain in her neck yesterday, but currently she is feeling okay.    Pertinent History Neck pain of insidious onset for about 1 year    Patient Stated Goals Learn what to do to help it    Currently in Pain? No/denies                             [facility] Adult PT Treatment/Exercise - 07/28/20 0001      Neck Exercises: Theraband   Shoulder Extension 10 reps;Green    Shoulder Extension Limitations 2 sets    Rows 10 reps;Green    Rows Limitations 2 sets       Neck Exercises: Seated   Shoulder Rolls Backwards    Shoulder Rolls Limitations 10x incorporating breathing up, back and down    Other Seated Exercise SNAGGS extension 2x10 5" holds, SNAGGs ROT 3x5 5" holds B       Manual Therapy   Manual Therapy Soft tissue mobilization;Manual Traction    Manual therapy comments Manual complete separate than rest of tx    Soft tissue mobilization STM to bilateral SCM - tolerated moderately well    Manual Traction Cervical traction supine - 10-30" holds - 5 minutes                  PT Education - 07/28/20 1633    Education Details Discussed plan to re-assess next session.    Person(s) Educated Patient    Methods Explanation    Comprehension Verbalized understanding            PT Short Term Goals - 06/24/20 1618      PT SHORT TERM GOAL #1   Title Patient will report understanding and regular compliance with  HEP to improve mobility and decrease pain.    Time 3    Period Weeks    Status New    Target Date 07/15/20      PT SHORT TERM GOAL #2   Title Patient will report improvement in overall subjective complaint of at least 25% for improved QOL.    Time 3    Period Weeks    Status New    Target Date 07/15/20             PT Long Term Goals - 06/24/20 1619      PT LONG TERM GOAL #1   Title Patient will report improvement in overall subjective complaint of at least 50% for improved QOL.    Time 6    Period Weeks    Status New    Target Date 08/05/20      PT LONG TERM GOAL #2   Title Patient will demonstrate pain-free cervical AROM for improved ability to observe environment and perform daily activities.    Time 6    Period Weeks    Status New    Target Date 08/05/20      PT LONG TERM GOAL #3   Title Patient will demonstrate improvement on FOTO of at least 10% indicating improvement in overall functional mobility.    Time 6    Period Weeks    Status New    Target Date 08/05/20                 Plan - 07/28/20  1634    Clinical Impression Statement Progressed postural strengthening this session with green theraband exercises as well as increasing the number of repetitions and sets performed. Patient reported minimal fatigue with these, and demonstrated good form following cueing. Therapist did also cue patient to take a small step forward to decrease resistance for improved form with exercises. Ended session with manual therapy with patient reporting feeling good at the end of the session. Plan to re-assess patient next session.    Personal Factors and Comorbidities Age;Time since onset of injury/illness/exacerbation;Comorbidity 2    Comorbidities Asthma; osteoporosis    Examination-Activity Limitations Lift;Sit    Examination-Participation Restrictions Meal Prep;Community Activity    Stability/Clinical Decision Making Stable/Uncomplicated    Rehab Potential Good    PT Frequency 2x / week    PT Duration 6 weeks    PT Treatment/Interventions Biofeedback;Cryotherapy;ADLs/Self Care Home Management;Electrical Stimulation;Moist Heat;Traction;DME Instruction;Gait training;Functional mobility training;Therapeutic activities;Therapeutic exercise;Balance training;Neuromuscular re-education;Patient/family education;Manual techniques;Passive range of motion;Dry needling;Energy conservation;Taping    PT Next Visit Plan Re-assess next session    PT Home Exercise Plan 8/12: Cervical retraction; 06/30/20: scap retraction, shoulder rolls, upper trap stretching; 9/3 w's at wall, serratus protraction, SNAGGs, cervical ROT; 07/21/20: TMJ deviation stretch to the LT 10x10''           Patient will benefit from skilled therapeutic intervention in order to improve the following deficits and impairments:  Pain, Improper body mechanics, Decreased mobility, Postural dysfunction, Decreased activity tolerance, Decreased endurance, Decreased range of motion, Hypomobility  Visit Diagnosis: Cervicalgia     Problem List Patient  Active Problem List   Diagnosis Date Noted  . Dyspnea on exertion 01/08/2018  . Moderate persistent asthma 10/13/2017  . Cough variant asthma  vs UACS 10/12/2017  . Nipple dermatitis 01/23/2017  . Annual physical exam 02/10/2015  . Routine gynecological examination 02/05/2014  . Gastroesophageal reflux disease   . Palpitations   . Chest pain   . Osteoporosis   .  Anxiety     Clarene Critchley 07/28/2020, 4:35 PM  [place] [address] [place], [place], [address] Phone: [phone]   Fax:  [phone]  Name: Laniesha Das MRN: [id number] Date of Birth: 08-31-1941

## 2020-07-30 ENCOUNTER — Other Ambulatory Visit: Payer: Self-pay

## 2020-07-30 ENCOUNTER — Ambulatory Visit (HOSPITAL_COMMUNITY): Payer: Medicare Other | Admitting: Physical Therapy

## 2020-07-30 ENCOUNTER — Encounter (HOSPITAL_COMMUNITY): Payer: Self-pay | Admitting: Physical Therapy

## 2020-07-30 DIAGNOSIS — M542 Cervicalgia: Secondary | ICD-10-CM

## 2020-07-30 NOTE — Therapy (Signed)
[place], [place], [address] Phone: [phone]   Fax:  [phone]  Physical Therapy Treatment  Patient Details  Name: Mckenzie Collins MRN: [id number] Date of Birth: 1941-07-13 Referring Provider (PT): Elwin Sleight, DO   Encounter Date: 07/30/2020   PHYSICAL THERAPY DISCHARGE SUMMARY  Visits from Start of Care: 10  Current functional level related to goals / functional outcomes: See below   Remaining deficits: See below   Education / Equipment: HEP, following up with MD  Plan: Patient agrees to discharge.  Patient goals were met. Patient is being discharged due to meeting the stated rehab goals.  ?????        PT End of Session - 07/30/20 1522    Visit Number 10    Number of Visits 12    Date for PT Re-Evaluation 08/05/20    Authorization Type [employer]HC Medicare    Progress Note Due on Visit 10    PT Start Time 1518    PT Stop Time 1546    PT Time Calculation (min) 28 min    Activity Tolerance Patient tolerated treatment well    Behavior During Therapy WFL for tasks assessed/performed           Past Medical History:  Diagnosis Date   Asthma    In childhood   Breast mass left   skin lesion around 11-12 x 2 -3 weeks   Chest pain    Dyspnea 2005   Negative stress nuclear in 2005; normal echocardiogram   Gastroesophageal reflux disease    hiatal hernia; gastritis; dysphagia   Hemorrhoids    Inverted nipple left   always   Low back pain    Microscopic hematuria    Osteoporosis    Palpitations 2003   few short runs of supraventricular tachycardia on Holter    Past Surgical History:  Procedure Laterality Date   COLONOSCOPY  2011   TONSILLECTOMY      There were no vitals filed for this visit.   Subjective Assessment - 07/30/20 1520    Subjective Patient reports that she is feeling good and feels ready to be discharged from physical therapy. Patient reports having improved  60% since starting therapy. Patient reports that she is still having some pain involving her hearing and she plans to follow-up with a specialist regarding this.    Pertinent History Neck pain of insidious onset for about 1 year    Patient Stated Goals Learn what to do to help it    Currently in Pain? No/denies              [facility] PT Assessment - 07/30/20 0001      Assessment   Medical Diagnosis Cervical Spondylosis     Referring Provider (PT) Elwin Sleight, DO    Next MD Visit Unknown      Precautions   Precautions None      Restrictions   Weight Bearing Restrictions No      Observation/Other Assessments   Focus on Therapeutic Outcomes (FOTO)  65%   was 53%     AROM   Cervical Flexion 60   was 53   Cervical Extension 50 very slight pain at end ROM   was 35 some pain   Cervical - Right Side Bend 40   was 30   Cervical - Left Side Bend 40 no pain   was 35 some pain   Cervical - Right Rotation 65   was  55   Cervical - Left Rotation 60   was 40                                PT Education - 07/30/20 1553    Education Details Discussed re-assessment findings and following up with MD as needed.    Person(s) Educated Patient    Methods Explanation    Comprehension Verbalized understanding            PT Short Term Goals - 07/30/20 1524      PT SHORT TERM GOAL #1   Title Patient will report understanding and regular compliance with HEP to improve mobility and decrease pain.    Time 3    Period Weeks    Status Achieved    Target Date 07/15/20      PT SHORT TERM GOAL #2   Title Patient will report improvement in overall subjective complaint of at least 25% for improved QOL.    Time 3    Period Weeks    Status Achieved    Target Date 07/15/20             PT Long Term Goals - 07/30/20 1524      PT LONG TERM GOAL #1   Title Patient will report improvement in overall subjective complaint of at least 50% for improved QOL.    Time 6    Period  Weeks    Status Achieved      PT LONG TERM GOAL #2   Title Patient will demonstrate pain-free cervical AROM for improved ability to observe environment and perform daily activities.    Time 6    Period Weeks    Status Achieved      PT LONG TERM GOAL #3   Title Patient will demonstrate improvement on FOTO of at least 10% indicating improvement in overall functional mobility.    Time 6    Period Weeks    Status Achieved                 Plan - 07/30/20 1557    Clinical Impression Statement Performed a re-assessment of patients progress towards goals this session. Patient achieved all short term and long term goals. Patient does report continued tenderness involving her hearing and discussed with patient following up with her physician regarding this. Also discussed with patient continuing her HEP and benefits of continuing exercises to contribute to maintaining her mobility and decreasing her pain level. Patient is being discharged at this time as she has met all of her goals and is pleased with current functional status.    Personal Factors and Comorbidities Age;Time since onset of injury/illness/exacerbation;Comorbidity 2    Comorbidities Asthma; osteoporosis    Examination-Activity Limitations Lift;Sit    Examination-Participation Restrictions Meal Prep;Community Activity    Stability/Clinical Decision Making Stable/Uncomplicated    Rehab Potential Good    PT Frequency 2x / week    PT Duration 6 weeks    PT Treatment/Interventions Biofeedback;Cryotherapy;ADLs/Self Care Home Management;Electrical Stimulation;Moist Heat;Traction;DME Instruction;Gait training;Functional mobility training;Therapeutic activities;Therapeutic exercise;Balance training;Neuromuscular re-education;Patient/family education;Manual techniques;Passive range of motion;Dry needling;Energy conservation;Taping    PT Next Visit Plan Discharged    PT Home Exercise Plan 8/12: Cervical retraction; 06/30/20: scap  retraction, shoulder rolls, upper trap stretching; 9/3 w's at wall, serratus protraction, SNAGGs, cervical ROT; 07/21/20: TMJ deviation stretch to the LT 10x10''           Patient will benefit  from skilled therapeutic intervention in order to improve the following deficits and impairments:  Pain, Improper body mechanics, Decreased mobility, Postural dysfunction, Decreased activity tolerance, Decreased endurance, Decreased range of motion, Hypomobility  Visit Diagnosis: Cervicalgia     Problem List Patient Active Problem List   Diagnosis Date Noted   Dyspnea on exertion 01/08/2018   Moderate persistent asthma 10/13/2017   Cough variant asthma  vs UACS 10/12/2017   Nipple dermatitis 01/23/2017   Annual physical exam 02/10/2015   Routine gynecological examination 02/05/2014   Gastroesophageal reflux disease    Palpitations    Chest pain    Osteoporosis    Anxiety    Clarene Critchley PT, DPT 3:59 PM, 07/30/20 [place], [place], [address] Phone: [phone]   Fax:  [phone]  Name: Mckenzie Collins MRN: [id number] Date of Birth: 1941/06/23

## 2020-08-19 ENCOUNTER — Ambulatory Visit: Payer: Medicare Other | Attending: Internal Medicine

## 2020-08-19 DIAGNOSIS — Z23 Encounter for immunization: Secondary | ICD-10-CM

## 2020-08-19 NOTE — Progress Notes (Signed)
   Covid-19 Vaccination Clinic  Name:  Andilyn Bettcher    MRN: [id number] DOB: 04-11-1941  08/19/2020  Ms. Thelander was observed post Covid-19 immunization for 15 minutes without incident. She was provided with Vaccine Information Sheet and instruction to access the V-Safe system.   Ms. Quito was instructed to call 911 with any severe reactions post vaccine: Marland Kitchen Difficulty breathing  . Swelling of face and throat  . A fast heartbeat  . A bad rash all over body  . Dizziness and weakness

## 2020-09-01 DIAGNOSIS — R030 Elevated blood-pressure reading, without diagnosis of hypertension: Secondary | ICD-10-CM | POA: Diagnosis not present

## 2020-09-01 DIAGNOSIS — M4312 Spondylolisthesis, cervical region: Secondary | ICD-10-CM | POA: Diagnosis not present

## 2020-09-01 DIAGNOSIS — M4302 Spondylolysis, cervical region: Secondary | ICD-10-CM | POA: Diagnosis not present

## 2020-09-06 DIAGNOSIS — Z23 Encounter for immunization: Secondary | ICD-10-CM | POA: Diagnosis not present

## 2020-10-04 DIAGNOSIS — J45902 Unspecified asthma with status asthmaticus: Secondary | ICD-10-CM | POA: Diagnosis not present

## 2020-10-04 DIAGNOSIS — Z79899 Other long term (current) drug therapy: Secondary | ICD-10-CM | POA: Diagnosis not present

## 2020-10-04 DIAGNOSIS — M810 Age-related osteoporosis without current pathological fracture: Secondary | ICD-10-CM | POA: Diagnosis not present

## 2020-10-04 DIAGNOSIS — K219 Gastro-esophageal reflux disease without esophagitis: Secondary | ICD-10-CM | POA: Diagnosis not present

## 2020-10-11 DIAGNOSIS — M816 Localized osteoporosis [Lequesne]: Secondary | ICD-10-CM | POA: Diagnosis not present

## 2020-10-11 DIAGNOSIS — Z0000 Encounter for general adult medical examination without abnormal findings: Secondary | ICD-10-CM | POA: Diagnosis not present

## 2020-10-11 DIAGNOSIS — Z0001 Encounter for general adult medical examination with abnormal findings: Secondary | ICD-10-CM | POA: Diagnosis not present

## 2020-10-11 DIAGNOSIS — R490 Dysphonia: Secondary | ICD-10-CM | POA: Diagnosis not present

## 2020-10-11 DIAGNOSIS — K219 Gastro-esophageal reflux disease without esophagitis: Secondary | ICD-10-CM | POA: Diagnosis not present

## 2020-10-15 ENCOUNTER — Encounter (INDEPENDENT_AMBULATORY_CARE_PROVIDER_SITE_OTHER): Payer: Self-pay | Admitting: *Deleted

## 2020-10-26 ENCOUNTER — Other Ambulatory Visit: Payer: Self-pay | Admitting: Internal Medicine

## 2020-10-26 DIAGNOSIS — Z1231 Encounter for screening mammogram for malignant neoplasm of breast: Secondary | ICD-10-CM

## 2020-11-09 ENCOUNTER — Encounter (INDEPENDENT_AMBULATORY_CARE_PROVIDER_SITE_OTHER): Payer: Self-pay

## 2020-11-09 ENCOUNTER — Other Ambulatory Visit (INDEPENDENT_AMBULATORY_CARE_PROVIDER_SITE_OTHER): Payer: Self-pay

## 2020-11-09 ENCOUNTER — Telehealth (INDEPENDENT_AMBULATORY_CARE_PROVIDER_SITE_OTHER): Payer: Self-pay

## 2020-11-09 DIAGNOSIS — Z1211 Encounter for screening for malignant neoplasm of colon: Secondary | ICD-10-CM

## 2020-11-09 MED ORDER — SUPREP BOWEL PREP KIT 17.5-3.13-1.6 GM/177ML PO SOLN: 354.0000 mL | Freq: Once | ORAL | 0 refills | Status: AC

## 2020-11-09 NOTE — Telephone Encounter (Signed)
Referring MD/PCP: Ouida Sills   Procedure: Tcs  Reason/Indication:  Screening  Has patient had this procedure before?  yes  If so, when, by whom and where?  2011  Is there a family history of colon cancer?  no  Who?  What age when diagnosed?    Is patient diabetic?   no      Does patient have prosthetic heart valve or mechanical valve?  no  Do you have a pacemaker/defibrillator?  no  Has patient ever had endocarditis/atrial fibrillation? no  Have you had a stroke/heart attack last 6 mths? no  Does patient use oxygen? no  Has patient had joint replacement within last 12 months?  no  Is patient constipated or do they take laxatives? no  Does patient have a history of alcohol/drug use?  no  Is patient on blood thinner such as Coumadin, Plavix and/or Aspirin? no  Medications: Pantoprazole 40mg  daily, Symbicort 80/4/5 1 puff every morning, eye drips daily, centrum silver daily, calcium bid, acetaminophen prn  Allergies: nkda  Procedure date & time: 12/08/20 8:30

## 2020-11-10 ENCOUNTER — Encounter (INDEPENDENT_AMBULATORY_CARE_PROVIDER_SITE_OTHER): Payer: Self-pay

## 2020-11-11 ENCOUNTER — Other Ambulatory Visit: Payer: Medicare Other

## 2020-11-11 DIAGNOSIS — Z20822 Contact with and (suspected) exposure to covid-19: Secondary | ICD-10-CM | POA: Diagnosis not present

## 2020-11-13 LAB — SARS-COV-2, NAA 2 DAY TAT

## 2020-11-13 LAB — NOVEL CORONAVIRUS, NAA: SARS-CoV-2, NAA: NOT DETECTED

## 2020-11-15 ENCOUNTER — Ambulatory Visit (INDEPENDENT_AMBULATORY_CARE_PROVIDER_SITE_OTHER): Payer: Medicare Other | Admitting: Otolaryngology

## 2020-11-24 DIAGNOSIS — N181 Chronic kidney disease, stage 1: Secondary | ICD-10-CM | POA: Diagnosis not present

## 2020-11-24 DIAGNOSIS — Z79899 Other long term (current) drug therapy: Secondary | ICD-10-CM | POA: Diagnosis not present

## 2020-11-30 ENCOUNTER — Other Ambulatory Visit: Payer: Self-pay

## 2020-11-30 ENCOUNTER — Ambulatory Visit (INDEPENDENT_AMBULATORY_CARE_PROVIDER_SITE_OTHER): Payer: Medicare Other | Admitting: Otolaryngology

## 2020-11-30 ENCOUNTER — Encounter (INDEPENDENT_AMBULATORY_CARE_PROVIDER_SITE_OTHER): Payer: Self-pay | Admitting: Otolaryngology

## 2020-11-30 VITALS — Temp 97.2°F

## 2020-11-30 DIAGNOSIS — H93233 Hyperacusis, bilateral: Secondary | ICD-10-CM | POA: Diagnosis not present

## 2020-11-30 DIAGNOSIS — K219 Gastro-esophageal reflux disease without esophagitis: Secondary | ICD-10-CM

## 2020-11-30 DIAGNOSIS — R490 Dysphonia: Secondary | ICD-10-CM

## 2020-11-30 NOTE — Progress Notes (Signed)
HPI: Mckenzie Collins is a 80 y.o. female who presents is referred by her PCP for evaluation of sensitivity to certain sounds.  Presently she states that when she talks loudly her own speech tends to bother her.  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  She had a hearing test performed about a year ago that showed mild hearing loss.  She has not noted that much change in her hearing but has had more discomfort with certain sounds.  She also states that she has intermittent hoarseness although on discussion with her in the office she has no significant hoarseness.  She does have history of reflux disease and takes pantoprazole first thing in the morning.  She notices most of her reflux symptoms more at night but is doing better on the pantoprazole. She does not smoke. She does complain of a slight dry throat. She also complains of being slightly off balance when she is turning a lot and cooking in the kitchen..  Past Medical History:  Diagnosis Date  . Asthma    In childhood  . Breast mass left   skin lesion around 11-12 x 2 -3 weeks  . Chest pain   . Dyspnea 2005   Negative stress nuclear in 2005; normal echocardiogram  . Gastroesophageal reflux disease    hiatal hernia; gastritis; dysphagia  . Hemorrhoids   . Inverted nipple left   always  . Low back pain   . Microscopic hematuria   . Osteoporosis   . Palpitations 2003   few short runs of supraventricular tachycardia on Holter   Past Surgical History:  Procedure Laterality Date  . COLONOSCOPY  2011  . TONSILLECTOMY     Social History   Socioeconomic History  . Marital status: Married    Spouse name: Not on file  . Number of children: 2  . Years of education: Not on file  . Highest education level: Not on file  Occupational History  . Occupation: Retired    Comment: office work  Tobacco Use  . Smoking status: Never Smoker  . Smokeless tobacco: Never Used  Vaping Use  . Vaping Use: Never used  Substance and  Sexual Activity  . Alcohol use: Yes    Comment: rarely  . Drug use: No  . Sexual activity: Not Currently    Birth control/protection: Post-menopausal  Other Topics Concern  . Not on file  Social History Narrative  . Not on file   Social Determinants of Health   Financial Resource Strain: Not on file  Food Insecurity: Not on file  Transportation Needs: Not on file  Physical Activity: Not on file  Stress: Not on file  Social Connections: Not on file   Family History  Problem Relation Age of Onset  . Cancer Father        pancreatic  . Alzheimer's disease Father   . Alzheimer's disease Paternal Grandfather   . Congenital heart disease Brother   . Alzheimer's disease Sister    No Known Allergies Prior to Admission medications   Medication Sig Start Date End Date Taking? Authorizing Provider  budesonide-formoterol (SYMBICORT) 80-4.5 MCG/ACT inhaler Inhale 2 puffs into the lungs 2 (two) times daily. Patient taking differently: Inhale 1 puff into the lungs every morning. 01/08/18   Tanda Rockers, MD  calcium citrate-vitamin D (CITRACAL+D) 315-200 MG-UNIT tablet Take 1 tablet by mouth daily. 01/08/18   Tanda Rockers, MD  calcium elemental as carbonate (TUMS ULTRA 1000) 400 MG chewable tablet Chew 1,000  mg by mouth daily as needed for heartburn.    [provider]  Cholecalciferol (VITAMIN D PO) Take 1,000 Units by mouth daily.    [provider]  denosumab (PROLIA) 60 MG/ML SOSY injection Inject 60 mg into the skin every 6 (six) months.    [provider]  Multiple Vitamins-Minerals (CENTRUM SILVER ULTRA WOMENS PO) Take 1 tablet by mouth daily.    [provider]  pantoprazole (PROTONIX) 40 MG tablet Take 1 tablet (40 mg total) by mouth daily. Take 30-60 min before first meal of the day 01/08/18   Tanda Rockers, MD  Propylene Glycol (SYSTANE BALANCE) 0.6 % SOLN Place 1 drop into both eyes daily as needed (dry eyes).    [provider]      Positive ROS: Otherwise negative.  Concerning her imbalance she does not describe any vertigo or spinning sensation.  All other systems have been reviewed and were otherwise negative with the exception of those mentioned in the HPI and as above.  Physical Exam: Constitutional: Alert, well-appearing, no acute distress Ears: External ears without lesions or tenderness.  She had minimal wax buildup in both ear canals that was cleaned with curettes.  TMs were clear bilaterally with good mobility pneumatic otoscopy.  Dix-Hallpike testing was negative for any evidence of BPPV.  On hearing screening with the 1024 tuning fork she has a very mild hearing loss in both ears which was symmetric. Nasal: External nose without lesions. Septum midline with mild rhinitis.  Both millimeters regions were clear with no signs of infection.. Clear nasal passages otherwise. Oral: Lips and gums without lesions. Tongue and palate mucosa without lesions. Posterior oropharynx clear.  Patient is status post tonsillectomy. Fiberoptic laryngoscopy was performed through the left nostril.  The nasopharynx was clear.  The base of tongue vallecula and epiglottis were normal.  Vocal cords were clear bilaterally with normal vocal cord mobility and no vocal cord lesions noted.  Both piriform sinuses were clear.  She had minimal arytenoid mucosal edema with no erythema or mucosal abnormalities noted. Neck: No palpable adenopathy or masses Respiratory: Breathing comfortably  Skin: No facial/neck lesions or rash noted.  Laryngoscopy  Date/Time: 11/30/2020 2:20 PM Performed by: Rozetta Nunnery, MD Authorized by: Rozetta Nunnery, MD   Consent:    Consent obtained:  Verbal   Consent given by:  Patient Procedure details:    Indications: hoarseness, dysphagia, or aspiration     Medication:  Afrin   Instrument: flexible fiberoptic laryngoscope     Scope location: left nare   Sinus:    Left nasopharynx: normal    Mouth:    Oropharynx: normal     Vallecula: normal     Base of tongue: normal     Epiglottis: normal   Throat:    True vocal cords: normal   Comments:     On fiberoptic laryngoscopy through the left nostril the hypopharynx and larynx were clear.  No vocal cord abnormalities noted.  Both cords with symmetric mobility.  Very minimal arytenoid edema with minimal findings of significant reflux disease.    Assessment: Subjective hoarseness with normal vocal cord and laryngeal examination on fiberoptic laryngoscopy. 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  Plan: Discussed whether concerning use of the pantoprazole.  Instead of taking it first thing in the morning would recommend taking it before dinner as this will provide better nighttime coverage when she is having more of a reflux type symptoms. Reassured her of normal laryngeal  and vocal cord examination. Concerning the sound sensitivity she expresses.  I gave her information on [facility] tinnitus and sound sensitivity clinic that may be able to help her. Also discussed with her that clinically she has some mild hearing loss and that hearing aids may be beneficial although I am not sure they will help with her complaints of sound sensitivity or discomfort in her own voice when she speaks. She was not scheduled for an audiologic testing today but had a previous hearing test about a year ago and clinical exam with tuning forks today she does have a mild hearing loss in both ears consistent with presbycusis.   Radene Journey, MD   CC:

## 2020-12-02 ENCOUNTER — Other Ambulatory Visit: Payer: Self-pay | Admitting: Internal Medicine

## 2020-12-02 DIAGNOSIS — R7989 Other specified abnormal findings of blood chemistry: Secondary | ICD-10-CM

## 2020-12-03 ENCOUNTER — Encounter (HOSPITAL_COMMUNITY): Payer: Self-pay

## 2020-12-06 ENCOUNTER — Other Ambulatory Visit (HOSPITAL_COMMUNITY)
Admission: RE | Admit: 2020-12-06 | Discharge: 2020-12-06 | Disposition: A | Discharge status: Home / Self Care | Payer: Medicare Other | Source: Ambulatory Visit | Attending: Internal Medicine | Admitting: Internal Medicine

## 2020-12-06 ENCOUNTER — Other Ambulatory Visit: Payer: Self-pay

## 2020-12-06 ENCOUNTER — Ambulatory Visit (INDEPENDENT_AMBULATORY_CARE_PROVIDER_SITE_OTHER): Payer: Medicare Other | Admitting: Otolaryngology

## 2020-12-06 ENCOUNTER — Encounter (HOSPITAL_COMMUNITY)
Admission: RE | Admit: 2020-12-06 | Discharge: 2020-12-06 | Disposition: A | Discharge status: Home / Self Care | Payer: Medicare Other | Source: Ambulatory Visit | Attending: Internal Medicine | Admitting: Internal Medicine

## 2020-12-06 DIAGNOSIS — Z1211 Encounter for screening for malignant neoplasm of colon: Secondary | ICD-10-CM

## 2020-12-06 DIAGNOSIS — Z01812 Encounter for preprocedural laboratory examination: Secondary | ICD-10-CM | POA: Insufficient documentation

## 2020-12-06 DIAGNOSIS — Z20822 Contact with and (suspected) exposure to covid-19: Secondary | ICD-10-CM | POA: Insufficient documentation

## 2020-12-06 LAB — SARS CORONAVIRUS 2 (TAT 6-24 HRS): SARS Coronavirus 2: NEGATIVE

## 2020-12-07 ENCOUNTER — Ambulatory Visit: Payer: Medicare Other

## 2020-12-08 ENCOUNTER — Encounter (HOSPITAL_COMMUNITY): Payer: Self-pay | Admitting: Internal Medicine

## 2020-12-08 ENCOUNTER — Other Ambulatory Visit: Payer: Self-pay

## 2020-12-08 ENCOUNTER — Encounter (INDEPENDENT_AMBULATORY_CARE_PROVIDER_SITE_OTHER): Payer: Self-pay | Admitting: *Deleted

## 2020-12-08 ENCOUNTER — Ambulatory Visit (HOSPITAL_COMMUNITY)
Admission: RE | Admit: 2020-12-08 | Discharge: 2020-12-08 | Disposition: A | Discharge status: Home / Self Care | Payer: Medicare Other | Attending: Internal Medicine | Admitting: Internal Medicine

## 2020-12-08 ENCOUNTER — Encounter (HOSPITAL_COMMUNITY)
Admission: RE | Disposition: A | Discharge status: Home / Self Care | Payer: Self-pay | Source: Home / Self Care | Attending: Internal Medicine

## 2020-12-08 ENCOUNTER — Ambulatory Visit (HOSPITAL_COMMUNITY): Payer: Medicare Other | Admitting: Certified Registered"

## 2020-12-08 DIAGNOSIS — Z79899 Other long term (current) drug therapy: Secondary | ICD-10-CM | POA: Insufficient documentation

## 2020-12-08 DIAGNOSIS — Z9841 Cataract extraction status, right eye: Secondary | ICD-10-CM | POA: Diagnosis not present

## 2020-12-08 DIAGNOSIS — K644 Residual hemorrhoidal skin tags: Secondary | ICD-10-CM | POA: Diagnosis not present

## 2020-12-08 DIAGNOSIS — Z7951 Long term (current) use of inhaled steroids: Secondary | ICD-10-CM | POA: Diagnosis not present

## 2020-12-08 DIAGNOSIS — Z800 Family history of malignant neoplasm of digestive organs: Secondary | ICD-10-CM | POA: Diagnosis not present

## 2020-12-08 DIAGNOSIS — Z820 Family history of epilepsy and other diseases of the nervous system: Secondary | ICD-10-CM | POA: Insufficient documentation

## 2020-12-08 DIAGNOSIS — Z8279 Family history of other congenital malformations, deformations and chromosomal abnormalities: Secondary | ICD-10-CM | POA: Insufficient documentation

## 2020-12-08 DIAGNOSIS — Z1211 Encounter for screening for malignant neoplasm of colon: Secondary | ICD-10-CM | POA: Diagnosis not present

## 2020-12-08 DIAGNOSIS — Z9842 Cataract extraction status, left eye: Secondary | ICD-10-CM | POA: Insufficient documentation

## 2020-12-08 DIAGNOSIS — K648 Other hemorrhoids: Secondary | ICD-10-CM | POA: Insufficient documentation

## 2020-12-08 HISTORY — PX: COLONOSCOPY WITH PROPOFOL: SHX5780

## 2020-12-08 LAB — HM COLONOSCOPY

## 2020-12-08 SURGERY — COLONOSCOPY WITH PROPOFOL
Anesthesia: General

## 2020-12-08 MED ORDER — PROPOFOL 10 MG/ML IV BOLUS
INTRAVENOUS | Status: DC | PRN
  Administered 2020-12-08: 100 mg via INTRAVENOUS

## 2020-12-08 MED ORDER — CHLORHEXIDINE GLUCONATE CLOTH 2 % EX PADS: 6.0000 | MEDICATED_PAD | Freq: Once | CUTANEOUS | Status: DC

## 2020-12-08 MED ORDER — PROPOFOL 500 MG/50ML IV EMUL
INTRAVENOUS | Status: DC | PRN
  Administered 2020-12-08: 150 ug/kg/min via INTRAVENOUS

## 2020-12-08 MED ORDER — LIDOCAINE HCL (CARDIAC) PF 100 MG/5ML IV SOSY
PREFILLED_SYRINGE | INTRAVENOUS | Status: DC | PRN
  Administered 2020-12-08: 50 mg via INTRAVENOUS

## 2020-12-08 MED ORDER — STERILE WATER FOR IRRIGATION IR SOLN
Status: DC | PRN
  Administered 2020-12-08: 100 mL

## 2020-12-08 MED ORDER — LACTATED RINGERS IV SOLN: INTRAVENOUS | Status: DC

## 2020-12-08 NOTE — Transfer of Care (Signed)
Immediate Anesthesia Transfer of Care Note  Patient: Mckenzie Collins  Procedure(s) Performed: COLONOSCOPY WITH PROPOFOL (N/A )  Patient Location: Short Stay  Anesthesia Type:General  Level of Consciousness: awake, alert  and oriented  Airway & Oxygen Therapy: Patient Spontanous Breathing  Post-op Assessment: Report given to RN, Post -op Vital signs reviewed and stable and Patient moving all extremities X 4  Post vital signs: Reviewed and stable  Last Vitals:  Vitals Value Taken Time  BP 96/46 12/08/20 0853  Temp 36.6 C 12/08/20 0853  Pulse 80 12/08/20 0853  Resp 18 12/08/20 0853  SpO2 96 % 12/08/20 0853    Last Pain:  Vitals:   12/08/20 0853  TempSrc: Oral  PainSc: 0-No pain      Patients Stated Pain Goal: 1 (09/62/83 6629)  Complications: No complications documented.

## 2020-12-08 NOTE — H&P (Signed)
Mckenzie Collins is an 80 y.o. female.   Chief Complaint: Patient is here for colonoscopy. HPI: Patient is 80 year old Caucasian female who is here for screening colonoscopy.  Last exam was unremarkable in 2011.  She denies abdominal pain change in bowel habits or rectal bleeding.  She has good appetite and her weight has been stable. She does not take aspirin or anticoagulants. Family history is negative for CRC  Past Medical History:  Diagnosis Date  . Asthma    In childhood  . Breast mass left   skin lesion around 11-12 x 2 -3 weeks  . Chest pain   . Dyspnea 2005   Negative stress nuclear in 2005; normal echocardiogram  . Gastroesophageal reflux disease    hiatal hernia; gastritis; dysphagia  . Hemorrhoids   . Inverted nipple left   always  . Low back pain   . Microscopic hematuria   . Osteoporosis   . Palpitations 2003   few short runs of supraventricular tachycardia on Holter    Past Surgical History:  Procedure Laterality Date  . CATARACT EXTRACTION Bilateral   . COLONOSCOPY  2011  . TONSILLECTOMY      Family History  Problem Relation Age of Onset  . Cancer Father        pancreatic  . Alzheimer's disease Father   . Alzheimer's disease Paternal Grandfather   . Congenital heart disease Brother   . Alzheimer's disease Sister    Social History:  reports that she has never smoked. She has never used smokeless tobacco. She reports current alcohol use. She reports that she does not use drugs.  Allergies: No Known Allergies  Medications Prior to Admission  Medication Sig Dispense Refill  . budesonide-formoterol (SYMBICORT) 80-4.5 MCG/ACT inhaler Inhale 2 puffs into the lungs 2 (two) times daily. (Patient taking differently: Inhale 1 puff into the lungs every morning.) 1 Inhaler 11  . calcium citrate-vitamin D (CITRACAL+D) 315-200 MG-UNIT tablet Take 1 tablet by mouth daily.    . calcium elemental as carbonate (TUMS ULTRA 1000) 400 MG chewable tablet Chew 1,000 mg by  mouth daily as needed for heartburn.    . Cholecalciferol (VITAMIN D PO) Take 1,000 Units by mouth daily.    Marland Kitchen denosumab (PROLIA) 60 MG/ML SOSY injection Inject 60 mg into the skin every 6 (six) months.    . Multiple Vitamins-Minerals (CENTRUM SILVER ULTRA WOMENS PO) Take 1 tablet by mouth daily.    . pantoprazole (PROTONIX) 40 MG tablet Take 1 tablet (40 mg total) by mouth daily. Take 30-60 min before first meal of the day 30 tablet 2  . Propylene Glycol (SYSTANE BALANCE) 0.6 % SOLN Place 1 drop into both eyes daily as needed (dry eyes).      Results for orders placed or performed during the hospital encounter of 12/06/20 (from the past 48 hour(s))  SARS CORONAVIRUS 2 (TAT 6-24 HRS) Nasopharyngeal Nasopharyngeal Swab     Status: None   Collection Time: 12/06/20 11:21 AM   Specimen: Nasopharyngeal Swab  Result Value Ref Range   SARS Coronavirus 2 NEGATIVE NEGATIVE    Comment: (NOTE) SARS-CoV-2 target nucleic acids are NOT DETECTED.  The SARS-CoV-2 RNA is generally detectable in upper and lower respiratory specimens during the acute phase of infection. Negative results do not preclude SARS-CoV-2 infection, do not rule out co-infections with other pathogens, and should not be used as the sole basis for treatment or other patient management decisions. Negative results must be combined with clinical observations, patient history,  and epidemiological information. The expected result is Negative.  Fact Sheet for Patients: SugarRoll.be  Fact Sheet for Healthcare Providers: https://www.woods-mathews.com/  This test is not yet approved or cleared by the [place] FDA and  has been authorized for detection and/or diagnosis of SARS-CoV-2 by FDA under an Emergency Use Authorization (EUA). This EUA will remain  in effect (meaning this test can be used) for the duration of the COVID-19 declaration under Se ction 564(b)(1) of the Act, 21  U.S.C. section 360bbb-3(b)(1), unless the authorization is terminated or revoked sooner.  Performed at [facility], [place] [address], [place], [place] [address]    No results found.  Review of Systems  Blood pressure 110/61, pulse 85, temperature 97.8 F (36.6 C), temperature source Oral, resp. rate 16, height 5\' 3"  (1.6 m), SpO2 96 %. Physical Exam HENT:     Mouth/Throat:     Mouth: Mucous membranes are moist.     Pharynx: Oropharynx is clear.  Eyes:     General: No scleral icterus.    Conjunctiva/sclera: Conjunctivae normal.  Cardiovascular:     Rate and Rhythm: Normal rate and regular rhythm.     Heart sounds: Normal heart sounds. No murmur heard.   Pulmonary:     Effort: Pulmonary effort is normal.     Breath sounds: Normal breath sounds.  Abdominal:     General: There is no distension.     Palpations: Abdomen is soft. There is no mass.     Tenderness: There is no abdominal tenderness.  Musculoskeletal:     Cervical back: Neck supple.  Lymphadenopathy:     Cervical: No cervical adenopathy.  Neurological:     Mental Status: She is alert.      Assessment/Plan  Average risk screening colonoscopy  Hildred Laser, MD 12/08/2020, 8:26 AM

## 2020-12-08 NOTE — Anesthesia Postprocedure Evaluation (Signed)
Anesthesia Post Note  Patient: Mckenzie Collins  Procedure(s) Performed: COLONOSCOPY WITH PROPOFOL (N/A )  Patient location during evaluation: Phase II Anesthesia Type: General Level of consciousness: awake and alert and oriented Pain management: pain level controlled Vital Signs Assessment: post-procedure vital signs reviewed and stable Respiratory status: spontaneous breathing, nonlabored ventilation and respiratory function stable Cardiovascular status: blood pressure returned to baseline and stable Postop Assessment: no apparent nausea or vomiting Anesthetic complications: no   No complications documented.   Last Vitals:  Vitals:   12/08/20 0730 12/08/20 0853  BP: 110/61 (!) 96/46  Pulse: 85 80  Resp: 16 18  Temp: 36.6 C 36.6 C  SpO2: 96% 96%    Last Pain:  Vitals:   12/08/20 0853  TempSrc: Oral  PainSc: 0-No pain                 Orlie Dakin

## 2020-12-08 NOTE — Discharge Instructions (Signed)
Resume usual medication and diet as before. No driving for 24 hours. No more screening colonoscopies.          Colonoscopy, Adult, Care After This sheet gives you information about how to care for yourself after your procedure. Your doctor may also give you more specific instructions. If you have problems or questions, call your doctor. What can I expect after the procedure? After the procedure, it is common to have:  A small amount of blood in your poop (stool) for 24 hours.  Some gas.  Mild cramping or bloating in your belly (abdomen). Follow these instructions at home: Eating and drinking  Drink enough fluid to keep your pee (urine) pale yellow.  Follow instructions from your doctor about what you cannot eat or drink.  Return to your normal diet as told by your doctor. Avoid heavy or fried foods that are hard to digest.   Activity  Rest as told by your doctor.  Do not sit for a long time without moving. Get up to take short walks every 1-2 hours. This is important. Ask for help if you feel weak or unsteady.  Return to your normal activities as told by your doctor. Ask your doctor what activities are safe for you. To help cramping and bloating:  Try walking around.  Put heat on your belly as told by your doctor. Use the heat source that your doctor recommends, such as a moist heat pack or a heating pad. ? Put a towel between your skin and the heat source. ? Leave the heat on for 20-30 minutes. ? Remove the heat if your skin turns bright red. This is very important if you are unable to feel pain, heat, or cold. You may have a greater risk of getting burned.   General instructions  If you were given a medicine to help you relax (sedative) during your procedure, it can affect you for many hours. Do not drive or use machinery until your doctor says that it is safe.  For the first 24 hours after the procedure: ? Do not sign important documents. ? Do not drink  alcohol. ? Do your daily activities more slowly than normal. ? Eat foods that are soft and easy to digest.  Take over-the-counter or prescription medicines only as told by your doctor.  Keep all follow-up visits as told by your doctor. This is important. Contact a doctor if:  You have blood in your poop 2-3 days after the procedure. Get help right away if:  You have more than a small amount of blood in your poop.  You see large clumps of tissue (blood clots) in your poop.  Your belly is swollen.  You feel like you may vomit (nauseous).  You vomit.  You have a fever.  You have belly pain that gets worse, and medicine does not help your pain. Summary  After the procedure, it is common to have a small amount of blood in your poop. You may also have mild cramping and bloating in your belly.  If you were given a medicine to help you relax (sedative) during your procedure, it can affect you for many hours. Do not drive or use machinery until your doctor says that it is safe.  Get help right away if you have a lot of blood in your poop, feel like you may vomit, have a fever, or have more belly pain. This information is not intended to replace advice given to you by your health care  provider. Make sure you discuss any questions you have with your health care provider. Document Revised: 09/05/2019 Document Reviewed: 05/26/2019 Elsevier Patient Education  2021 [place] After This sheet gives you information about how to care for yourself after your procedure. Your health care provider may also give you more specific instructions. If you have problems or questions, contact your health care provider. What can I expect after the procedure? After the procedure, it is common to have:  Tiredness.  Forgetfulness about what happened after the procedure.  Impaired judgment for important decisions.  Nausea or vomiting.  Some difficulty with  balance. Follow these instructions at home: For the time period you were told by your health care provider:  Rest as needed.  Do not participate in activities where you could fall or become injured.  Do not drive or use machinery.  Do not drink alcohol.  Do not take sleeping pills or medicines that cause drowsiness.  Do not make important decisions or sign legal documents.  Do not take care of children on your own.      Eating and drinking  Follow the diet that is recommended by your health care provider.  Drink enough fluid to keep your urine pale yellow.  If you vomit: ? Drink water, juice, or soup when you can drink without vomiting. ? Make sure you have little or no nausea before eating solid foods. General instructions  Have a responsible adult stay with you for the time you are told. It is important to have someone help care for you until you are awake and alert.  Take over-the-counter and prescription medicines only as told by your health care provider.  If you have sleep apnea, surgery and certain medicines can increase your risk for breathing problems. Follow instructions from your health care provider about wearing your sleep device: ? Anytime you are sleeping, including during daytime naps. ? While taking prescription pain medicines, sleeping medicines, or medicines that make you drowsy.  Avoid smoking.  Keep all follow-up visits as told by your health care provider. This is important. Contact a health care provider if:  You keep feeling nauseous or you keep vomiting.  You feel light-headed.  You are still sleepy or having trouble with balance after 24 hours.  You develop a rash.  You have a fever.  You have redness or swelling around the IV site. Get help right away if:  You have trouble breathing.  You have new-onset confusion at home. Summary  For several hours after your procedure, you may feel tired. You may also be forgetful and have poor  judgment.  Have a responsible adult stay with you for the time you are told. It is important to have someone help care for you until you are awake and alert.  Rest as told. Do not drive or operate machinery. Do not drink alcohol or take sleeping pills.  Get help right away if you have trouble breathing, or if you suddenly become confused. This information is not intended to replace advice given to you by your health care provider. Make sure you discuss any questions you have with your health care provider. Document Revised: 07/15/2020 Document Reviewed: 10/02/2019 Elsevier Patient Education  2021 [place].      Hemorrhoids Hemorrhoids are swollen veins in and around the rectum or anus. There are two types of hemorrhoids:  Internal hemorrhoids. These occur in the veins that are just inside the rectum.  They may poke through to the outside and become irritated and painful.  External hemorrhoids. These occur in the veins that are outside the anus and can be felt as a painful swelling or hard lump near the anus. Most hemorrhoids do not cause serious problems, and they can be managed with home treatments such as diet and lifestyle changes. If home treatments do not help the symptoms, procedures can be done to shrink or remove the hemorrhoids. What are the causes? This condition is caused by increased pressure in the anal area. This pressure may result from various things, including:  Constipation.  Straining to have a bowel movement.  Diarrhea.  Pregnancy.  Obesity.  Sitting for long periods of time.  Heavy lifting or other activity that causes you to strain.  Anal sex.  Riding a bike for a long period of time. What are the signs or symptoms? Symptoms of this condition include:  Pain.  Anal itching or irritation.  Rectal bleeding.  Leakage of stool (feces).  Anal swelling.  One or more lumps around the anus. How is this diagnosed? This condition can often be  diagnosed through a visual exam. Other exams or tests may also be done, such as:  An exam that involves feeling the rectal area with a gloved hand (digital rectal exam).  An exam of the anal canal that is done using a small tube (anoscope).  A blood test, if you have lost a significant amount of blood.  A test to look inside the colon using a flexible tube with a camera on the end (sigmoidoscopy or colonoscopy). How is this treated? This condition can usually be treated at home. However, various procedures may be done if dietary changes, lifestyle changes, and other home treatments do not help your symptoms. These procedures can help make the hemorrhoids smaller or remove them completely. Some of these procedures involve surgery, and others do not. Common procedures include:  Rubber band ligation. Rubber bands are placed at the base of the hemorrhoids to cut off their blood supply.  Sclerotherapy. Medicine is injected into the hemorrhoids to shrink them.  Infrared coagulation. A type of light energy is used to get rid of the hemorrhoids.  Hemorrhoidectomy surgery. The hemorrhoids are surgically removed, and the veins that supply them are tied off.  Stapled hemorrhoidopexy surgery. The surgeon staples the base of the hemorrhoid to the rectal wall. Follow these instructions at home: Eating and drinking  Eat foods that have a lot of fiber in them, such as whole grains, beans, nuts, fruits, and vegetables.  Ask your health care provider about taking products that have added fiber (fiber supplements).  Reduce the amount of fat in your diet. You can do this by eating low-fat dairy products, eating less red meat, and avoiding processed foods.  Drink enough fluid to keep your urine pale yellow.   Managing pain and swelling  Take warm sitz baths for 20 minutes, 3-4 times a day to ease pain and discomfort. You may do this in a bathtub or using a portable sitz bath that fits over the  toilet.  If directed, apply ice to the affected area. Using ice packs between sitz baths may be helpful. ? Put ice in a plastic bag. ? Place a towel between your skin and the bag. ? Leave the ice on for 20 minutes, 2-3 times a day.   General instructions  Take over-the-counter and prescription medicines only as told by your health care provider.  Use medicated  creams or suppositories as told.  Get regular exercise. Ask your health care provider how much and what kind of exercise is best for you. In general, you should do moderate exercise for at least 30 minutes on most days of the week (150 minutes each week). This can include activities such as walking, biking, or yoga.  Go to the bathroom when you have the urge to have a bowel movement. Do not wait.  Avoid straining to have bowel movements.  Keep the anal area dry and clean. Use wet toilet paper or moist towelettes after a bowel movement.  Do not sit on the toilet for long periods of time. This increases blood pooling and pain.  Keep all follow-up visits as told by your health care provider. This is important. Contact a health care provider if you have:  Increasing pain and swelling that are not controlled by treatment or medicine.  Difficulty having a bowel movement, or you are unable to have a bowel movement.  Pain or inflammation outside the area of the hemorrhoids. Get help right away if you have:  Uncontrolled bleeding from your rectum. Summary  Hemorrhoids are swollen veins in and around the rectum or anus.  Most hemorrhoids can be managed with home treatments such as diet and lifestyle changes.  Taking warm sitz baths can help ease pain and discomfort.  In severe cases, procedures or surgery can be done to shrink or remove the hemorrhoids. This information is not intended to replace advice given to you by your health care provider. Make sure you discuss any questions you have with your health care  provider. Document Revised: 03/28/2019 Document Reviewed: 03/21/2018 Elsevier Patient Education  [place].

## 2020-12-08 NOTE — Op Note (Signed)
[facility] Patient Name: Mckenzie Collins Procedure Date: 12/08/2020 8:14 AM MRN: [id number] Date of Birth: [birth year] Attending MD: Hildred Laser , MD CSN: [id number] Age: 80 Admit Type: Outpatient Procedure:                Colonoscopy Indications:              Screening for colorectal malignant neoplasm Providers:                Hildred Laser, MD, Lambert Mody, Raphael Gibney, Technician Referring MD:             Asencion Noble, MD Medicines:                Propofol per Anesthesia Complications:            No immediate complications. Estimated Blood Loss:     Estimated blood loss: none. Procedure:                Pre-Anesthesia Assessment:                           - Prior to the procedure, a History and Physical                            was performed, and patient medications and                            allergies were reviewed. The patient's tolerance of                            previous anesthesia was also reviewed. The risks                            and benefits of the procedure and the sedation                            options and risks were discussed with the patient.                            All questions were answered, and informed consent                            was obtained. Prior Anticoagulants: The patient has                            taken no previous anticoagulant or antiplatelet                            agents. ASA Grade Assessment: II - A patient with                            mild systemic disease. After reviewing the risks  and benefits, the patient was deemed in                            satisfactory condition to undergo the procedure.                           After obtaining informed consent, the colonoscope                            was passed under direct vision. Throughout the                            procedure, the patient's blood pressure, pulse, and                             oxygen saturations were monitored continuously. The                            [id number]-[id number] ([id number]) scope was introduced through                            the anus and advanced to the the cecum, identified                            by appendiceal orifice and ileocecal valve. The                            colonoscopy was performed without difficulty. The                            patient tolerated the procedure well. The quality                            of the bowel preparation was good. The ileocecal                            valve, appendiceal orifice, and rectum were                            photographed. Scope In: 8:37:02 AM Scope Out: 8:50:19 AM Scope Withdrawal Time: 0 hours 7 minutes 6 seconds  Total Procedure Duration: 0 hours 13 minutes 17 seconds  Findings:      The perianal and digital rectal examinations were normal.      The colon (entire examined portion) appeared normal.      External and internal hemorrhoids were found during retroflexion. The       hemorrhoids were small. Impression:               - The entire examined colon is normal.                           - External and internal hemorrhoids.                           - No specimens collected.  Moderate Sedation:      Per Anesthesia Care Recommendation:           - Patient has a contact number available for                            emergencies. The signs and symptoms of potential                            delayed complications were discussed with the                            patient. Return to normal activities tomorrow.                            Written discharge instructions were provided to the                            patient.                           - Resume previous diet today.                           - Continue present medications.                           - No repeat colonoscopy due to age and the absence                            of advanced adenomas. Procedure Code(s):        ---  Professional ---                           [phone], Colonoscopy, flexible; diagnostic, including                            collection of specimen(s) by brushing or washing,                            when performed (separate procedure) Diagnosis Code(s):        --- Professional ---                           Z12.11, Encounter for screening for malignant                            neoplasm of colon                           K64.4, Residual hemorrhoidal skin tags CPT copyright 2019 American Medical Association. All rights reserved. The codes documented in this report are preliminary and upon coder review may  be revised to meet current compliance requirements. Hildred Laser, MD Hildred Laser, MD 12/08/2020 8:57:02 AM This report has been signed electronically. Number of Addenda: 0

## 2020-12-08 NOTE — Anesthesia Preprocedure Evaluation (Signed)
Anesthesia Evaluation  Patient identified by MRN, date of birth, ID band Patient awake    Reviewed: Allergy & Precautions, H&P , NPO status , Patient's Chart, lab work & pertinent test results, reviewed documented beta blocker date and time   Airway Mallampati: II  TM Distance: >3 FB Neck ROM: full    Dental no notable dental hx. (+) Teeth Intact   Pulmonary shortness of breath, asthma ,    Pulmonary exam normal breath sounds clear to auscultation       Cardiovascular Exercise Tolerance: Good negative cardio ROS   Rhythm:regular Rate:Normal     Neuro/Psych PSYCHIATRIC DISORDERS Anxiety negative neurological ROS     GI/Hepatic Neg liver ROS, GERD  Medicated,  Endo/Other  negative endocrine ROS  Renal/GU negative Renal ROS  negative genitourinary   Musculoskeletal   Abdominal   Peds  Hematology negative hematology ROS (+)   Anesthesia Other Findings   Reproductive/Obstetrics negative OB ROS                             Anesthesia Physical Anesthesia Plan  ASA: II  Anesthesia Plan: General   Post-op Pain Management:    Induction:   PONV Risk Score and Plan: Propofol infusion  Airway Management Planned:   Additional Equipment:   Intra-op Plan:   Post-operative Plan:   Informed Consent: I have reviewed the patients History and Physical, chart, labs and discussed the procedure including the risks, benefits and alternatives for the proposed anesthesia with the patient or authorized representative who has indicated his/her understanding and acceptance.     Dental Advisory Given  Plan Discussed with: CRNA  Anesthesia Plan Comments:         Anesthesia Quick Evaluation

## 2020-12-08 NOTE — Anesthesia Procedure Notes (Signed)
Date/Time: 12/08/2020 8:36 AM Performed by: Orlie Dakin, CRNA Pre-anesthesia Checklist: Patient identified, Emergency Drugs available, Suction available and Patient being monitored Patient Re-evaluated:Patient Re-evaluated prior to induction Oxygen Delivery Method: Nasal cannula Placement Confirmation: positive ETCO2

## 2020-12-09 ENCOUNTER — Ambulatory Visit (HOSPITAL_COMMUNITY): Payer: Medicare Other

## 2020-12-10 ENCOUNTER — Encounter (HOSPITAL_COMMUNITY): Payer: Self-pay | Admitting: Internal Medicine

## 2020-12-14 ENCOUNTER — Ambulatory Visit (HOSPITAL_COMMUNITY): Payer: Medicare Other

## 2020-12-16 ENCOUNTER — Ambulatory Visit (HOSPITAL_COMMUNITY)
Admission: RE | Admit: 2020-12-16 | Discharge: 2020-12-16 | Disposition: A | Discharge status: Home / Self Care | Payer: Medicare Other | Source: Ambulatory Visit | Attending: Internal Medicine | Admitting: Internal Medicine

## 2020-12-16 ENCOUNTER — Other Ambulatory Visit: Payer: Self-pay

## 2020-12-16 DIAGNOSIS — R7989 Other specified abnormal findings of blood chemistry: Secondary | ICD-10-CM | POA: Insufficient documentation

## 2020-12-16 DIAGNOSIS — R944 Abnormal results of kidney function studies: Secondary | ICD-10-CM | POA: Diagnosis not present

## 2020-12-20 ENCOUNTER — Ambulatory Visit (HOSPITAL_COMMUNITY): Payer: Medicare Other

## 2020-12-22 ENCOUNTER — Ambulatory Visit: Payer: Medicare Other

## 2021-01-10 ENCOUNTER — Encounter (HOSPITAL_COMMUNITY): Payer: Self-pay

## 2021-01-10 ENCOUNTER — Other Ambulatory Visit: Payer: Self-pay

## 2021-01-10 ENCOUNTER — Encounter (HOSPITAL_COMMUNITY)
Admission: RE | Admit: 2021-01-10 | Discharge: 2021-01-10 | Disposition: A | Discharge status: Home / Self Care | Payer: Medicare Other | Source: Ambulatory Visit | Attending: Internal Medicine | Admitting: Internal Medicine

## 2021-01-10 DIAGNOSIS — M810 Age-related osteoporosis without current pathological fracture: Secondary | ICD-10-CM | POA: Insufficient documentation

## 2021-01-10 MED ORDER — DENOSUMAB 60 MG/ML ~~LOC~~ SOSY
60.0000 mg | PREFILLED_SYRINGE | Freq: Once | SUBCUTANEOUS | Status: AC
  Administered 2021-01-10: 60 mg via SUBCUTANEOUS

## 2021-02-03 ENCOUNTER — Other Ambulatory Visit: Payer: Self-pay

## 2021-02-03 ENCOUNTER — Ambulatory Visit
Admission: RE | Admit: 2021-02-03 | Discharge: 2021-02-03 | Disposition: A | Discharge status: Home / Self Care | Payer: Medicare Other | Source: Ambulatory Visit | Attending: Internal Medicine | Admitting: Internal Medicine

## 2021-02-03 DIAGNOSIS — Z1231 Encounter for screening mammogram for malignant neoplasm of breast: Secondary | ICD-10-CM

## 2021-02-28 ENCOUNTER — Other Ambulatory Visit: Payer: Medicare Other | Admitting: Adult Health

## 2021-03-29 DIAGNOSIS — H43813 Vitreous degeneration, bilateral: Secondary | ICD-10-CM | POA: Diagnosis not present

## 2021-04-18 ENCOUNTER — Ambulatory Visit (INDEPENDENT_AMBULATORY_CARE_PROVIDER_SITE_OTHER): Payer: Medicare Other | Admitting: Adult Health

## 2021-04-18 ENCOUNTER — Encounter: Payer: Self-pay | Admitting: Adult Health

## 2021-04-18 ENCOUNTER — Other Ambulatory Visit: Payer: Self-pay

## 2021-04-18 VITALS — BP 119/76 | HR 83 | Ht 64.0 in | Wt 141.0 lb

## 2021-04-18 DIAGNOSIS — Z780 Asymptomatic menopausal state: Secondary | ICD-10-CM

## 2021-04-18 DIAGNOSIS — Z1211 Encounter for screening for malignant neoplasm of colon: Secondary | ICD-10-CM | POA: Diagnosis not present

## 2021-04-18 DIAGNOSIS — Z01419 Encounter for gynecological examination (general) (routine) without abnormal findings: Secondary | ICD-10-CM | POA: Diagnosis not present

## 2021-04-18 LAB — HEMOCCULT GUIAC POC 1CARD (OFFICE): Fecal Occult Blood, POC: NEGATIVE

## 2021-04-18 NOTE — Progress Notes (Signed)
Patient ID: Mckenzie Collins, female   DOB: 1941/10/04, 80 y.o.   MRN: [id number] History of Present Illness: Mckenzie Collins is a 80 year old white female,married, PM in for a well woman gyn exam, pap no longer needed. She is retired from Press [employer], but works some during tax season. PCP is Dr Mckenzie Collins.   Current Medications, Allergies, Past Medical History, Past Surgical History, Family History and Social History were reviewed in [employer] record.     Review of Systems: Patient denies any headaches, hearing loss, fatigue, blurred vision, shortness of breath, chest pain, abdominal pain, problems with bowel movements, urination, or intercourse.(not active) No joint pain or mood swings. Denies any vaginal bleeding, has discharge at times, no itching or burning.   Physical Exam:BP 119/76 (BP Location: Right Arm, Patient Position: Sitting, Cuff Size: Normal)   Pulse 83   Ht 5\' 4"  (1.626 m)   Wt 141 lb (64 kg)   BMI 24.20 kg/m  General:  Well developed, well nourished, no acute distress Skin:  Warm and dry Neck:  Midline trachea, normal thyroid, good ROM, no lymphadenopathy,no carotid bruits heard Lungs; Clear to auscultation bilaterally Breast:  No dominant palpable mass, retraction, or nipple discharge,left nipple is chronically inverted. Cardiovascular: Regular rate and rhythm Abdomen:  Soft, non tender, no hepatosplenomegaly Pelvic:  External genitalia is normal in appearance, no lesions.  The vagina is pale with loss of moisture and rugae. Urethra has no lesions or masses. The cervix is smooth.  Uterus is felt to be normal size, shape, and contour.  No adnexal masses or tenderness noted.Bladder is non tender, no masses felt. Rectal: Good sphincter tone, no polyps, or hemorrhoids felt.  Hemoccult negative. Extremities/musculoskeletal:  No swelling or varicosities noted, no clubbing or cyanosis Psych:  No mood changes, alert and cooperative,seems happy AA is 0 Fall risk is  low pHQ 9 score is 3 GAD 7 score is 0  Upstream - 04/18/21 1135      Pregnancy Intention Screening   Does the patient want to become pregnant in the next year? No    Does the patient's partner want to become pregnant in the next year? No    Would the patient like to discuss contraceptive options today? No      Contraception Wrap Up   Current Method No Method - Other Reason   Postmenopauasal   End Method No Method - Other Reason   postmenopausal   Contraception Counseling Provided No         Examination chaperoned by Celene Squibb LPN.  Impression and Plan 1. Encounter for well woman exam with routine gynecological exam Physical with PCP Labs with PCP Mammogram yearly No more colonoscopy she says Follow up prn problems   2. Postmenopause   3. Encounter for screening fecal occult blood testing

## 2021-05-30 DIAGNOSIS — Z1283 Encounter for screening for malignant neoplasm of skin: Secondary | ICD-10-CM | POA: Diagnosis not present

## 2021-05-30 DIAGNOSIS — D225 Melanocytic nevi of trunk: Secondary | ICD-10-CM | POA: Diagnosis not present

## 2021-05-30 DIAGNOSIS — L308 Other specified dermatitis: Secondary | ICD-10-CM | POA: Diagnosis not present

## 2021-05-30 DIAGNOSIS — L908 Other atrophic disorders of skin: Secondary | ICD-10-CM | POA: Diagnosis not present

## 2021-06-12 DIAGNOSIS — J449 Chronic obstructive pulmonary disease, unspecified: Secondary | ICD-10-CM | POA: Diagnosis not present

## 2021-06-12 DIAGNOSIS — K219 Gastro-esophageal reflux disease without esophagitis: Secondary | ICD-10-CM | POA: Diagnosis not present

## 2021-07-11 ENCOUNTER — Ambulatory Visit (HOSPITAL_COMMUNITY): Payer: Medicare Other

## 2021-07-12 ENCOUNTER — Other Ambulatory Visit: Payer: Self-pay

## 2021-07-12 ENCOUNTER — Encounter (HOSPITAL_COMMUNITY)
Admission: RE | Admit: 2021-07-12 | Discharge: 2021-07-12 | Disposition: A | Discharge status: Home / Self Care | Payer: Medicare Other | Source: Ambulatory Visit | Attending: Internal Medicine | Admitting: Internal Medicine

## 2021-07-12 DIAGNOSIS — M810 Age-related osteoporosis without current pathological fracture: Secondary | ICD-10-CM | POA: Insufficient documentation

## 2021-07-12 MED ORDER — DENOSUMAB 60 MG/ML ~~LOC~~ SOSY
PREFILLED_SYRINGE | SUBCUTANEOUS | Status: AC
  Administered 2021-07-12: 60 mg via INTRAMUSCULAR
  Filled 2021-07-12: qty 1

## 2021-07-12 MED ORDER — DENOSUMAB 60 MG/ML ~~LOC~~ SOSY: 60.0000 mg | PREFILLED_SYRINGE | Freq: Once | SUBCUTANEOUS | Status: DC

## 2021-08-01 ENCOUNTER — Ambulatory Visit (HOSPITAL_COMMUNITY)
Admission: RE | Admit: 2021-08-01 | Discharge: 2021-08-01 | Disposition: A | Discharge status: Home / Self Care | Payer: Medicare Other | Source: Ambulatory Visit | Attending: Internal Medicine | Admitting: Internal Medicine

## 2021-08-01 ENCOUNTER — Other Ambulatory Visit: Payer: Self-pay

## 2021-08-01 ENCOUNTER — Other Ambulatory Visit (HOSPITAL_COMMUNITY): Payer: Self-pay | Admitting: Internal Medicine

## 2021-08-01 DIAGNOSIS — R5383 Other fatigue: Secondary | ICD-10-CM | POA: Diagnosis not present

## 2021-08-01 DIAGNOSIS — R0782 Intercostal pain: Secondary | ICD-10-CM | POA: Diagnosis not present

## 2021-08-01 DIAGNOSIS — R0781 Pleurodynia: Secondary | ICD-10-CM | POA: Insufficient documentation

## 2021-08-01 DIAGNOSIS — R918 Other nonspecific abnormal finding of lung field: Secondary | ICD-10-CM | POA: Diagnosis not present

## 2021-08-19 DIAGNOSIS — Z23 Encounter for immunization: Secondary | ICD-10-CM | POA: Diagnosis not present

## 2021-09-12 ENCOUNTER — Ambulatory Visit: Payer: Medicare Other | Admitting: Cardiology

## 2021-10-07 DIAGNOSIS — M810 Age-related osteoporosis without current pathological fracture: Secondary | ICD-10-CM | POA: Diagnosis not present

## 2021-10-07 DIAGNOSIS — H93239 Hyperacusis, unspecified ear: Secondary | ICD-10-CM | POA: Diagnosis not present

## 2021-10-07 DIAGNOSIS — K219 Gastro-esophageal reflux disease without esophagitis: Secondary | ICD-10-CM | POA: Diagnosis not present

## 2021-10-07 DIAGNOSIS — N183 Chronic kidney disease, stage 3 unspecified: Secondary | ICD-10-CM | POA: Diagnosis not present

## 2021-10-07 DIAGNOSIS — Z79899 Other long term (current) drug therapy: Secondary | ICD-10-CM | POA: Diagnosis not present

## 2021-10-14 DIAGNOSIS — G9332 Myalgic encephalomyelitis/chronic fatigue syndrome: Secondary | ICD-10-CM | POA: Diagnosis not present

## 2021-10-14 DIAGNOSIS — R000 Tachycardia, unspecified: Secondary | ICD-10-CM | POA: Diagnosis not present

## 2021-10-14 DIAGNOSIS — Z6823 Body mass index (BMI) 23.0-23.9, adult: Secondary | ICD-10-CM | POA: Diagnosis not present

## 2021-10-14 DIAGNOSIS — M810 Age-related osteoporosis without current pathological fracture: Secondary | ICD-10-CM | POA: Diagnosis not present

## 2021-10-14 DIAGNOSIS — K219 Gastro-esophageal reflux disease without esophagitis: Secondary | ICD-10-CM | POA: Diagnosis not present

## 2021-10-27 ENCOUNTER — Other Ambulatory Visit: Payer: Self-pay

## 2021-10-27 ENCOUNTER — Ambulatory Visit: Payer: Medicare Other | Admitting: Cardiology

## 2021-10-27 ENCOUNTER — Encounter: Payer: Self-pay | Admitting: Cardiology

## 2021-10-27 VITALS — BP 108/64 | HR 92 | Ht 63.0 in | Wt 137.0 lb

## 2021-10-27 DIAGNOSIS — R079 Chest pain, unspecified: Secondary | ICD-10-CM | POA: Diagnosis not present

## 2021-10-27 DIAGNOSIS — R0602 Shortness of breath: Secondary | ICD-10-CM

## 2021-10-27 DIAGNOSIS — J4540 Moderate persistent asthma, uncomplicated: Secondary | ICD-10-CM

## 2021-10-27 DIAGNOSIS — R5383 Other fatigue: Secondary | ICD-10-CM | POA: Diagnosis not present

## 2021-10-27 DIAGNOSIS — R0789 Other chest pain: Secondary | ICD-10-CM | POA: Diagnosis not present

## 2021-10-27 MED ORDER — METOPROLOL TARTRATE 50 MG PO TABS: ORAL_TABLET | ORAL | 0 refills | Status: DC

## 2021-10-27 NOTE — Patient Instructions (Addendum)
Medication Instructions:  Your physician recommends that you continue on your current medications as directed. Please refer to the Current Medication list given to you today.  *If you need a refill on your cardiac medications before your next appointment, please call your pharmacy*   Lab Work: None If you have labs (blood work) drawn today and your tests are completely normal, you will receive your results only by: [place] (if you have MyChart) OR A paper copy in the mail If you have any lab test that is abnormal or we need to change your treatment, we will call you to review the results.   Testing/Procedures: Your physician has requested that you have an echocardiogram. Echocardiography is a painless test that uses sound waves to create images of your heart. It provides your doctor with information about the size and shape of your heart and how well your hearts chambers and valves are working. This procedure takes approximately one hour. There are no restrictions for this procedure.  Coronary CT Scan    Follow-Up: At [facility], you and your health needs are our priority.  As part of our continuing mission to provide you with exceptional heart care, we have created designated Provider Care Teams.  These Care Teams include your primary Cardiologist (physician) and Advanced Practice Providers (APPs -  Physician Assistants and Nurse Practitioners) who all work together to provide you with the care you need, when you need it.  We recommend signing up for the patient portal called "MyChart".  Sign up information is provided on this After Visit Summary.  MyChart is used to connect with patients for Virtual Visits (Telemedicine).  Patients are able to view lab/test results, encounter notes, upcoming appointments, etc.  Non-urgent messages can be sent to your provider as well.   To learn more about what you can do with MyChart, go to [email or web address].    Your next appointment:    Follow Up- As Needed with Dr. Marlou Porch    Other Instructions   Your cardiac CT will be scheduled at one of the below locations:   [facility] [address] [place], [place] [address] [phone]  [place] [address] [place], [place] [address] [phone]  If scheduled at [facility], please arrive at the [facility] main entrance (entrance A) of [facility] 30 minutes prior to test start time. You can use the FREE valet parking offered at the main entrance (encouraged to control the heart rate for the test) Proceed to the [facility] Radiology Department (first floor) to check-in and test prep.  If scheduled at [facility], please arrive 15 mins early for check-in and test prep.  Please follow these instructions carefully (unless otherwise directed):  TAKE METOPROLOL TARTRATE 50 MG TABLET TWO (2) HOURS PRIOR TO YOUR SCAN   HOLD SYMBICORT/ALBUTEROL THE MORNING OF YOUR SCAN   On the Night Before the Test: Be sure to Drink plenty of water. Do not consume any caffeinated/decaffeinated beverages or chocolate 12 hours prior to your test. Do not take any antihistamines 12 hours prior to your test.  On the Day of the Test: Drink plenty of water until 1 hour prior to the test. Do not eat any food 4 hours prior to the test. You may take your regular medications prior to the test.  Take metoprolol (Lopressor) two hours prior to test. HOLD Furosemide/Hydrochlorothiazide morning of the test. FEMALES- please wear underwire-free bra  if available, avoid dresses & tight clothing      After the Test: Drink plenty of water. After receiving IV contrast, you may experience a mild flushed feeling. This is normal. On occasion, you may experience a mild rash up to 24 hours after the test. This is not dangerous. If this occurs, you can take Benadryl 25 mg and increase your fluid  intake. If you experience trouble breathing, this can be serious. If it is severe call 911 IMMEDIATELY. If it is mild, please call our office. If you take any of these medications: Glipizide/Metformin, Avandament, Glucavance, please do not take 48 hours after completing test unless otherwise instructed.  Please allow 2-4 weeks for scheduling of routine cardiac CTs. Some insurance companies require a pre-authorization which may delay scheduling of this test.   For non-scheduling related questions, please contact the cardiac imaging nurse navigator should you have any questions/concerns: Marchia Bond, Cardiac Imaging Nurse Navigator Gordy Clement, Cardiac Imaging Nurse Navigator [facility] Direct Office Dial: [phone]   For scheduling needs, including cancellations and rescheduling, please call [place], [phone].

## 2021-10-27 NOTE — Assessment & Plan Note (Signed)
Likely multifactorial.  Thankfully her lab work was reassuring, normal sed rate, no anemia, normal electrolytes.  Continue to advocate daily exercise.  We will also be evaluating her heart further see below.

## 2021-10-27 NOTE — Assessment & Plan Note (Signed)
Currently on Symbicort and albuterol.  On morning prior to CT scan she will hold both of these.

## 2021-10-27 NOTE — Assessment & Plan Note (Addendum)
Chest tightness noted which seems to be improved with Symbicort however this could be an anginal equivalent with possible underlying coronary artery disease.  We will go ahead and check a coronary CT scan.  Metoprolol tartrate 50 mg will be given prior to study.  Hydrate well.  Hold Symbicort and albuterol morning of test.  We will also check an echocardiogram to ensure proper structure and function.

## 2021-10-27 NOTE — Progress Notes (Signed)
Cardiology Office Note:    Date:  10/27/2021   ID:  Mckenzie Collins, DOB [birth year], MRN [id number]  PCP:  Asencion Noble, MD   [facility] Providers Cardiologist:  None     Referring MD: Asencion Noble, MD    History of Present Illness:    Mckenzie Collins is a 80 y.o. female here for the evaluation of fatigue at the request of Dr. Asencion Noble.  Has a prior history of GERD, anxiety, COPD.  Never smoked.  And family history, her brother has an unspecified congenital anomaly of the heart.  Otherwise no early family history of coronary artery disease.  Over the past few months she has been noticing worsening fatigue.  Occasional muscle aches.  Tires very easily with activity such as baking.  Has noted some lateral to posterior rib discomfort at times.  Has been quite diligent with her Symbicort and albuterol as well as pantoprazole.  She had COVID in August 2022.  Her fatigue was prior to this COVID infection.  Was walking and felt tightness in chest and takes Symbicort for this. Improved. Sometimes breathing harder.   A few years ago had sensitivity to sound. Can feel off balance. Turn head left and right, especially when cooking.    Past Medical History:  Diagnosis Date   Asthma    In childhood   Breast mass left   skin lesion around 11-12 x 2 -3 weeks   Chest pain    Dyspnea 2005   Negative stress nuclear in 2005; normal echocardiogram   Gastroesophageal reflux disease    hiatal hernia; gastritis; dysphagia   Hemorrhoids    Inverted nipple left   always   Low back pain    Microscopic hematuria    Osteoporosis    Palpitations 2003   few short runs of supraventricular tachycardia on Holter    Past Surgical History:  Procedure Laterality Date   CATARACT EXTRACTION Bilateral    COLONOSCOPY  2011   COLONOSCOPY WITH PROPOFOL N/A 12/08/2020   Procedure: COLONOSCOPY WITH PROPOFOL;  Surgeon: Rogene Houston, MD;  Location: [facility];  Service: Endoscopy;  Laterality: N/A;   8:30   TONSILLECTOMY      Current Medications: Current Meds  Medication Sig   albuterol (VENTOLIN HFA) 108 (90 Base) MCG/ACT inhaler INL 2 INHALATION PO BID   budesonide-formoterol (SYMBICORT) 80-4.5 MCG/ACT inhaler Inhale 2 puffs into the lungs 2 (two) times daily. (Patient taking differently: Inhale 1 puff into the lungs every morning.)   calcium citrate-vitamin D (CITRACAL+D) 315-200 MG-UNIT tablet Take 1 tablet by mouth daily.   calcium elemental as carbonate (TUMS ULTRA 1000) 400 MG chewable tablet Chew 1,000 mg by mouth daily as needed for heartburn.   Cholecalciferol (VITAMIN D PO) Take 1,000 Units by mouth daily.   denosumab (PROLIA) 60 MG/ML SOSY injection Inject 60 mg into the skin every 6 (six) months.   metoprolol tartrate (LOPRESSOR) 50 MG tablet Take 1 tablets two (2) hours prior to CT Scan   Multiple Vitamins-Minerals (CENTRUM SILVER ULTRA WOMENS PO) Take 1 tablet by mouth daily.   pantoprazole (PROTONIX) 40 MG tablet Take 1 tablet (40 mg total) by mouth daily. Take 30-60 min before first meal of the day   Propylene Glycol (SYSTANE BALANCE) 0.6 % SOLN Place 1 drop into both eyes daily as needed (dry eyes).     Allergies:   Patient has no known allergies.   Social History   Socioeconomic History   Marital status:  Married    Spouse name: Not on file   Number of children: 2   Years of education: Not on file   Highest education level: Not on file  Occupational History   Occupation: Retired    Comment: office work  Tobacco Use   Smoking status: Never   Smokeless tobacco: Never  Scientific laboratory technician Use: Never used  Substance and Sexual Activity   Alcohol use: Yes    Comment: rarely   Drug use: No   Sexual activity: Not Currently    Birth control/protection: Post-menopausal  Other Topics Concern   Not on file  Social History Narrative   Not on file   Social Determinants of Health   Financial Resource Strain: Low Risk    Difficulty of Paying Living Expenses:  Not hard at all  Food Insecurity: No Food Insecurity   Worried About Charity fundraiser in the Last Year: Never true   [place] in the Last Year: Never true  Transportation Needs: No Transportation Needs   Lack of Transportation (Medical): No   Lack of Transportation (Non-Medical): No  Physical Activity: Sufficiently Active   Days of Exercise per Week: 6 days   Minutes of Exercise per Session: 50 min  Stress: No Stress Concern Present   Feeling of Stress : Not at all  Social Connections: Socially Integrated   Frequency of Communication with Friends and Family: More than three times a week   Frequency of Social Gatherings with Friends and Family: Twice a week   Attends Religious Services: More than 4 times per year   Active Member of [employer] or Organizations: Yes   Attends Music therapist: More than 4 times per year   Marital Status: Married     Family History: The patient's family history includes Alzheimer's disease in her father, paternal grandfather, and sister; Cancer in her father; Congenital heart disease in her brother.  ROS:   Please see the history of present illness.    No bleeding no fevers no chills all other systems reviewed and are negative.  EKGs/Labs/Other Studies Reviewed:    The following studies were reviewed today: Prior office notes, lab work  EKG:   Prior EKG from 10/11/2020 shows sinus rhythm 91 bpm no other obvious abnormalities.  Fax to tracing somewhat difficult to view.  Recent Labs: No results found for requested labs within last 8760 hours.  Recent Lipid Panel No results found for: CHOL, TRIG, HDL, CHOLHDL, VLDL, LDLCALC, LDLDIRECT  Chest x-ray showed chronic bronchitis type images.  White count 6.8 hemoglobin 12.8 hematocrit 38.4 platelets 279 glucose 141 creatinine 0.84 sodium 139 potassium 4.4 ALT 14 TSH 1.  6, free T4 1.2, sed rate 10 normal-these labs are from outside source on 08/01/2021  Risk Assessment/Calculations:               Physical Exam:    VS:  BP 108/64    Pulse 92    Ht 5\' 3"  (1.6 m)    Wt 137 lb (62.1 kg)    SpO2 96%    BMI 24.27 kg/m     Wt Readings from Last 3 Encounters:  10/27/21 137 lb (62.1 kg)  04/18/21 141 lb (64 kg)  12/15/19 140 lb (63.5 kg)     GEN:  Well nourished, well developed in no acute distress HEENT: Normal NECK: No JVD; No carotid bruits LYMPHATICS: No lymphadenopathy CARDIAC: RRR, no murmurs, no rubs, gallops RESPIRATORY:  Clear to auscultation  without rales, wheezing or rhonchi  ABDOMEN: Soft, non-tender, non-distended MUSCULOSKELETAL:  No edema; No deformity  SKIN: Warm and dry NEUROLOGIC:  Alert and oriented x 3 PSYCHIATRIC:  Normal affect   ASSESSMENT:    1. SOB (shortness of breath)   2. Fatigue, unspecified type   3. Moderate persistent asthma without complication   4. Other chest pain   5. Chest pain, unspecified type    PLAN:    In order of problems listed above:  Fatigue Likely multifactorial.  Thankfully her lab work was reassuring, normal sed rate, no anemia, normal electrolytes.  Continue to advocate daily exercise.  We will also be evaluating her heart further see below.  Moderate persistent asthma Currently on Symbicort and albuterol.  On morning prior to CT scan she will hold both of these.  Chest pain Chest tightness noted which seems to be improved with Symbicort however this could be an anginal equivalent with possible underlying coronary artery disease.  We will go ahead and check a coronary CT scan.  Metoprolol tartrate 50 mg will be given prior to study.  Hydrate well.  Hold Symbicort and albuterol morning of test.  We will also check an echocardiogram to ensure proper structure and function.       Medication Adjustments/Labs and Tests Ordered: Current medicines are reviewed at length with the patient today.  Concerns regarding medicines are outlined above.  Orders Placed This Encounter  Procedures   CT CORONARY MORPH  W/CTA COR W/SCORE W/CA W/CM &/OR WO/CM   EKG 12-Lead   ECHOCARDIOGRAM COMPLETE    Meds ordered this encounter  Medications   metoprolol tartrate (LOPRESSOR) 50 MG tablet    Sig: Take 1 tablets two (2) hours prior to CT Scan    Dispense:  1 tablet    Refill:  0     Patient Instructions  Medication Instructions:  Your physician recommends that you continue on your current medications as directed. Please refer to the Current Medication list given to you today.  *If you need a refill on your cardiac medications before your next appointment, please call your pharmacy*   Lab Work: None If you have labs (blood work) drawn today and your tests are completely normal, you will receive your results only by: [place] (if you have MyChart) OR A paper copy in the mail If you have any lab test that is abnormal or we need to change your treatment, we will call you to review the results.   Testing/Procedures: Your physician has requested that you have an echocardiogram. Echocardiography is a painless test that uses sound waves to create images of your heart. It provides your doctor with information about the size and shape of your heart and how well your hearts chambers and valves are working. This procedure takes approximately one hour. There are no restrictions for this procedure.  Coronary CT Scan    Follow-Up: At [facility], you and your health needs are our priority.  As part of our continuing mission to provide you with exceptional heart care, we have created designated Provider Care Teams.  These Care Teams include your primary Cardiologist (physician) and Advanced Practice Providers (APPs -  Physician Assistants and Nurse Practitioners) who all work together to provide you with the care you need, when you need it.  We recommend signing up for the patient portal called "MyChart".  Sign up information is provided on this After Visit Summary.  MyChart is used to connect with  patients for Virtual  Visits (Telemedicine).  Patients are able to view lab/test results, encounter notes, upcoming appointments, etc.  Non-urgent messages can be sent to your provider as well.   To learn more about what you can do with MyChart, go to [email or web address].    Your next appointment:   Follow Up- As Needed with Dr. Marlou Porch    Other Instructions   Your cardiac CT will be scheduled at one of the below locations:   [facility] [address] [place], [place] [address] [phone]  [place] [address] [place], [place] [address] [phone]  If scheduled at [facility], please arrive at the [facility] main entrance (entrance A) of [facility] 30 minutes prior to test start time. You can use the FREE valet parking offered at the main entrance (encouraged to control the heart rate for the test) Proceed to the [facility] Radiology Department (first floor) to check-in and test prep.  If scheduled at [facility], please arrive 15 mins early for check-in and test prep.  Please follow these instructions carefully (unless otherwise directed):  TAKE METOPROLOL TARTRATE 50 MG TABLET TWO (2) HOURS PRIOR TO YOUR SCAN   HOLD SYMBICORT/ALBUTEROL THE MORNING OF YOUR SCAN   On the Night Before the Test: Be sure to Drink plenty of water. Do not consume any caffeinated/decaffeinated beverages or chocolate 12 hours prior to your test. Do not take any antihistamines 12 hours prior to your test.  On the Day of the Test: Drink plenty of water until 1 hour prior to the test. Do not eat any food 4 hours prior to the test. You may take your regular medications prior to the test.  Take metoprolol (Lopressor) two hours prior to test. HOLD Furosemide/Hydrochlorothiazide morning of the test. FEMALES- please wear underwire-free bra if available, avoid dresses & tight  clothing      After the Test: Drink plenty of water. After receiving IV contrast, you may experience a mild flushed feeling. This is normal. On occasion, you may experience a mild rash up to 24 hours after the test. This is not dangerous. If this occurs, you can take Benadryl 25 mg and increase your fluid intake. If you experience trouble breathing, this can be serious. If it is severe call 911 IMMEDIATELY. If it is mild, please call our office. If you take any of these medications: Glipizide/Metformin, Avandament, Glucavance, please do not take 48 hours after completing test unless otherwise instructed.  Please allow 2-4 weeks for scheduling of routine cardiac CTs. Some insurance companies require a pre-authorization which may delay scheduling of this test.   For non-scheduling related questions, please contact the cardiac imaging nurse navigator should you have any questions/concerns: Marchia Bond, Cardiac Imaging Nurse Navigator Gordy Clement, Cardiac Imaging Nurse Navigator [facility] Direct Office Dial: [phone]   For scheduling needs, including cancellations and rescheduling, please call [place], [phone].    Signed, Candee Furbish, MD  10/27/2021 11:55 AM    [facility]

## 2021-11-08 ENCOUNTER — Telehealth (HOSPITAL_COMMUNITY): Payer: Self-pay | Admitting: *Deleted

## 2021-11-08 DIAGNOSIS — Z01812 Encounter for preprocedural laboratory examination: Secondary | ICD-10-CM

## 2021-11-08 NOTE — Telephone Encounter (Signed)
Reaching out to patient to offer assistance regarding upcoming cardiac imaging study; pt verbalizes understanding of appt date/time, but wishes to reschedule due to [place]. Patient reschedule for January 12 and a BMET order placed. She is aware to go to [facility] prior to new cardiac CT appointment and that the RN navigator will call her closer to appointment to review instructions.  Gordy Clement RN Navigator Cardiac Imaging [facility] [phone] office [phone] cell

## 2021-11-10 ENCOUNTER — Ambulatory Visit (HOSPITAL_COMMUNITY): Admission: RE | Admit: 2021-11-10 | Payer: Medicare Other | Source: Ambulatory Visit

## 2021-11-16 ENCOUNTER — Other Ambulatory Visit: Payer: Self-pay

## 2021-11-16 ENCOUNTER — Other Ambulatory Visit (HOSPITAL_COMMUNITY)
Admission: RE | Admit: 2021-11-16 | Discharge: 2021-11-16 | Disposition: A | Discharge status: Home / Self Care | Payer: Medicare Other | Source: Ambulatory Visit | Attending: Cardiology | Admitting: Cardiology

## 2021-11-16 DIAGNOSIS — Z01812 Encounter for preprocedural laboratory examination: Secondary | ICD-10-CM | POA: Insufficient documentation

## 2021-11-16 LAB — BASIC METABOLIC PANEL
Anion gap: 10 (ref 5–15)
BUN: 25 mg/dL — ABNORMAL HIGH (ref 8–23)
CO2: 23 mmol/L (ref 22–32)
Calcium: 8.7 mg/dL — ABNORMAL LOW (ref 8.9–10.3)
Chloride: 102 mmol/L (ref 98–111)
Creatinine, Ser: 1.01 mg/dL — ABNORMAL HIGH (ref 0.44–1.00)
GFR, Estimated: 57 mL/min — ABNORMAL LOW (ref >60)
Glucose, Bld: 161 mg/dL — ABNORMAL HIGH (ref 70–99)
Potassium: 3.8 mmol/L (ref 3.5–5.1)
Sodium: 135 mmol/L (ref 135–145)

## 2021-11-23 ENCOUNTER — Telehealth (HOSPITAL_COMMUNITY): Payer: Self-pay | Admitting: Emergency Medicine

## 2021-11-23 NOTE — Telephone Encounter (Signed)
Reaching out to patient to offer assistance regarding upcoming cardiac imaging study; pt verbalizes understanding of appt date/time, parking situation and where to check in, pre-test NPO status and medications ordered, and verified current allergies; name and call back number provided for further questions should they arise Marchia Bond RN Navigator Cardiac Imaging [facility] [phone] office [phone] cell  50mg  metoprolol  Denies iv issues Arrival 930a

## 2021-11-24 ENCOUNTER — Ambulatory Visit (HOSPITAL_COMMUNITY)
Admission: RE | Admit: 2021-11-24 | Discharge: 2021-11-24 | Disposition: A | Discharge status: Home / Self Care | Payer: Medicare Other | Source: Ambulatory Visit | Attending: Cardiology | Admitting: Cardiology

## 2021-11-24 ENCOUNTER — Other Ambulatory Visit: Payer: Self-pay

## 2021-11-24 DIAGNOSIS — R079 Chest pain, unspecified: Secondary | ICD-10-CM | POA: Diagnosis not present

## 2021-11-24 DIAGNOSIS — I700 Atherosclerosis of aorta: Secondary | ICD-10-CM | POA: Diagnosis not present

## 2021-11-24 DIAGNOSIS — R0789 Other chest pain: Secondary | ICD-10-CM | POA: Diagnosis not present

## 2021-11-24 DIAGNOSIS — R918 Other nonspecific abnormal finding of lung field: Secondary | ICD-10-CM | POA: Insufficient documentation

## 2021-11-24 DIAGNOSIS — I2510 Atherosclerotic heart disease of native coronary artery without angina pectoris: Secondary | ICD-10-CM | POA: Diagnosis not present

## 2021-11-24 DIAGNOSIS — R0602 Shortness of breath: Secondary | ICD-10-CM | POA: Insufficient documentation

## 2021-11-24 MED ORDER — NITROGLYCERIN 0.4 MG SL SUBL
SUBLINGUAL_TABLET | SUBLINGUAL | Status: AC
  Filled 2021-11-24: qty 2

## 2021-11-24 MED ORDER — NITROGLYCERIN 0.4 MG SL SUBL
0.8000 mg | SUBLINGUAL_TABLET | Freq: Once | SUBLINGUAL | Status: AC
  Administered 2021-11-24: 0.8 mg via SUBLINGUAL

## 2021-11-24 MED ORDER — METOPROLOL TARTRATE 5 MG/5ML IV SOLN
INTRAVENOUS | Status: AC
  Filled 2021-11-24: qty 10

## 2021-11-24 MED ORDER — IOHEXOL 350 MG/ML SOLN
95.0000 mL | Freq: Once | INTRAVENOUS | Status: AC | PRN
  Administered 2021-11-24: 95 mL via INTRAVENOUS

## 2021-11-24 NOTE — Progress Notes (Signed)
CT scan completed. Tolerated well. D/C home with husband, awake and alert. In no distress

## 2021-12-06 ENCOUNTER — Telehealth: Payer: Self-pay

## 2021-12-06 ENCOUNTER — Ambulatory Visit (HOSPITAL_COMMUNITY)
Admission: RE | Admit: 2021-12-06 | Discharge: 2021-12-06 | Disposition: A | Discharge status: Home / Self Care | Payer: Medicare Other | Source: Ambulatory Visit | Attending: Cardiology | Admitting: Cardiology

## 2021-12-06 ENCOUNTER — Other Ambulatory Visit: Payer: Self-pay

## 2021-12-06 DIAGNOSIS — R0602 Shortness of breath: Secondary | ICD-10-CM | POA: Diagnosis not present

## 2021-12-06 LAB — ECHOCARDIOGRAM COMPLETE
Area-P 1/2: 2.24 cm2
P 1/2 time: 463 msec
S' Lateral: 2.3 cm

## 2021-12-06 NOTE — Progress Notes (Signed)
*  PRELIMINARY RESULTS* Echocardiogram 2D Echocardiogram has been performed.  Mckenzie Collins 12/06/2021, 10:13 AM

## 2021-12-06 NOTE — Telephone Encounter (Signed)
-----   Message from Jerline Pain, MD sent at 12/06/2021  1:32 PM EST ----- Normal echocardiogram.  Normal pump function.  Mild aortic valve regurgitation which should be of no clinical consequence.  Excellent overall results. Candee Furbish, MD

## 2021-12-06 NOTE — Telephone Encounter (Signed)
Patient notified and verbalized understanding. Pt had no questions or concerns at this time. PCP copied.  ?

## 2021-12-11 DIAGNOSIS — J449 Chronic obstructive pulmonary disease, unspecified: Secondary | ICD-10-CM | POA: Diagnosis not present

## 2021-12-11 DIAGNOSIS — K219 Gastro-esophageal reflux disease without esophagitis: Secondary | ICD-10-CM | POA: Diagnosis not present

## 2021-12-26 DIAGNOSIS — D2271 Melanocytic nevi of right lower limb, including hip: Secondary | ICD-10-CM | POA: Diagnosis not present

## 2021-12-26 DIAGNOSIS — D225 Melanocytic nevi of trunk: Secondary | ICD-10-CM | POA: Diagnosis not present

## 2021-12-26 DIAGNOSIS — Z1283 Encounter for screening for malignant neoplasm of skin: Secondary | ICD-10-CM | POA: Diagnosis not present

## 2021-12-26 DIAGNOSIS — D485 Neoplasm of uncertain behavior of skin: Secondary | ICD-10-CM | POA: Diagnosis not present

## 2021-12-29 ENCOUNTER — Other Ambulatory Visit: Payer: Self-pay | Admitting: Internal Medicine

## 2021-12-29 DIAGNOSIS — Z1231 Encounter for screening mammogram for malignant neoplasm of breast: Secondary | ICD-10-CM

## 2021-12-30 ENCOUNTER — Other Ambulatory Visit (HOSPITAL_COMMUNITY): Payer: Self-pay | Admitting: Internal Medicine

## 2021-12-30 ENCOUNTER — Other Ambulatory Visit: Payer: Self-pay | Admitting: Internal Medicine

## 2021-12-30 DIAGNOSIS — R109 Unspecified abdominal pain: Secondary | ICD-10-CM

## 2021-12-30 DIAGNOSIS — I2510 Atherosclerotic heart disease of native coronary artery without angina pectoris: Secondary | ICD-10-CM | POA: Diagnosis not present

## 2021-12-30 DIAGNOSIS — I700 Atherosclerosis of aorta: Secondary | ICD-10-CM | POA: Diagnosis not present

## 2021-12-30 DIAGNOSIS — G9332 Myalgic encephalomyelitis/chronic fatigue syndrome: Secondary | ICD-10-CM | POA: Diagnosis not present

## 2021-12-30 DIAGNOSIS — R1010 Upper abdominal pain, unspecified: Secondary | ICD-10-CM | POA: Diagnosis not present

## 2022-01-10 ENCOUNTER — Encounter (HOSPITAL_COMMUNITY): Payer: Medicare Other

## 2022-01-11 DIAGNOSIS — L988 Other specified disorders of the skin and subcutaneous tissue: Secondary | ICD-10-CM | POA: Diagnosis not present

## 2022-01-11 DIAGNOSIS — D485 Neoplasm of uncertain behavior of skin: Secondary | ICD-10-CM | POA: Diagnosis not present

## 2022-01-17 ENCOUNTER — Encounter (HOSPITAL_COMMUNITY): Payer: Self-pay | Admitting: Radiology

## 2022-01-17 ENCOUNTER — Other Ambulatory Visit: Payer: Self-pay

## 2022-01-17 ENCOUNTER — Ambulatory Visit (HOSPITAL_COMMUNITY)
Admission: RE | Admit: 2022-01-17 | Discharge: 2022-01-17 | Disposition: A | Discharge status: Home / Self Care | Payer: Medicare Other | Source: Ambulatory Visit | Attending: Internal Medicine | Admitting: Internal Medicine

## 2022-01-17 DIAGNOSIS — R109 Unspecified abdominal pain: Secondary | ICD-10-CM | POA: Insufficient documentation

## 2022-01-17 LAB — POCT I-STAT CREATININE: Creatinine, Ser: 0.9 mg/dL (ref 0.44–1.00)

## 2022-01-17 MED ORDER — IOHEXOL 350 MG/ML SOLN
75.0000 mL | Freq: Once | INTRAVENOUS | Status: AC | PRN
  Administered 2022-01-17: 75 mL via INTRAVENOUS

## 2022-01-19 ENCOUNTER — Other Ambulatory Visit: Payer: Self-pay | Admitting: Internal Medicine

## 2022-01-19 ENCOUNTER — Other Ambulatory Visit (HOSPITAL_COMMUNITY): Payer: Self-pay | Admitting: Internal Medicine

## 2022-01-19 DIAGNOSIS — N839 Noninflammatory disorder of ovary, fallopian tube and broad ligament, unspecified: Secondary | ICD-10-CM

## 2022-01-26 ENCOUNTER — Ambulatory Visit (HOSPITAL_COMMUNITY)
Admission: RE | Admit: 2022-01-26 | Discharge: 2022-01-26 | Disposition: A | Discharge status: Home / Self Care | Payer: Medicare Other | Source: Ambulatory Visit | Attending: Internal Medicine | Admitting: Internal Medicine

## 2022-01-26 ENCOUNTER — Other Ambulatory Visit: Payer: Self-pay

## 2022-01-26 DIAGNOSIS — Z780 Asymptomatic menopausal state: Secondary | ICD-10-CM | POA: Diagnosis not present

## 2022-01-26 DIAGNOSIS — N839 Noninflammatory disorder of ovary, fallopian tube and broad ligament, unspecified: Secondary | ICD-10-CM | POA: Insufficient documentation

## 2022-02-06 ENCOUNTER — Ambulatory Visit
Admission: RE | Admit: 2022-02-06 | Discharge: 2022-02-06 | Disposition: A | Discharge status: Home / Self Care | Payer: Medicare Other | Source: Ambulatory Visit | Attending: Internal Medicine | Admitting: Internal Medicine

## 2022-02-06 DIAGNOSIS — Z1231 Encounter for screening mammogram for malignant neoplasm of breast: Secondary | ICD-10-CM | POA: Diagnosis not present

## 2022-02-10 ENCOUNTER — Other Ambulatory Visit: Payer: Self-pay | Admitting: Internal Medicine

## 2022-02-10 ENCOUNTER — Other Ambulatory Visit (HOSPITAL_COMMUNITY): Payer: Self-pay | Admitting: Internal Medicine

## 2022-02-10 DIAGNOSIS — N83201 Unspecified ovarian cyst, right side: Secondary | ICD-10-CM

## 2022-02-28 ENCOUNTER — Ambulatory Visit (HOSPITAL_COMMUNITY)
Admission: RE | Admit: 2022-02-28 | Discharge: 2022-02-28 | Disposition: A | Discharge status: Home / Self Care | Payer: Medicare Other | Source: Ambulatory Visit | Attending: Internal Medicine | Admitting: Internal Medicine

## 2022-02-28 DIAGNOSIS — N83201 Unspecified ovarian cyst, right side: Secondary | ICD-10-CM | POA: Insufficient documentation

## 2022-02-28 MED ORDER — GADOBUTROL 1 MMOL/ML IV SOLN
7.0000 mL | Freq: Once | INTRAVENOUS | Status: AC | PRN
  Administered 2022-02-28: 7 mL via INTRAVENOUS

## 2022-03-02 ENCOUNTER — Telehealth: Payer: Self-pay

## 2022-03-02 NOTE — Telephone Encounter (Signed)
Called patient (new) and set scheduled apt. For 03/16/22 @ 10:30 ?

## 2022-03-14 ENCOUNTER — Encounter: Payer: Self-pay | Admitting: Gynecologic Oncology

## 2022-03-15 DIAGNOSIS — R051 Acute cough: Secondary | ICD-10-CM | POA: Diagnosis not present

## 2022-03-15 DIAGNOSIS — M7061 Trochanteric bursitis, right hip: Secondary | ICD-10-CM | POA: Diagnosis not present

## 2022-03-16 ENCOUNTER — Inpatient Hospital Stay: Payer: Medicare Other

## 2022-03-16 ENCOUNTER — Other Ambulatory Visit: Payer: Self-pay

## 2022-03-16 ENCOUNTER — Inpatient Hospital Stay (HOSPITAL_BASED_OUTPATIENT_CLINIC_OR_DEPARTMENT_OTHER): Payer: Medicare Other | Admitting: Gynecologic Oncology

## 2022-03-16 ENCOUNTER — Encounter: Payer: Self-pay | Admitting: Gynecologic Oncology

## 2022-03-16 ENCOUNTER — Inpatient Hospital Stay: Payer: Medicare Other | Attending: Gynecologic Oncology | Admitting: Gynecologic Oncology

## 2022-03-16 VITALS — BP 139/59 | HR 87 | Temp 98.4°F | Resp 18 | Ht 63.0 in | Wt 134.0 lb

## 2022-03-16 DIAGNOSIS — K219 Gastro-esophageal reflux disease without esophagitis: Secondary | ICD-10-CM | POA: Diagnosis not present

## 2022-03-16 DIAGNOSIS — K449 Diaphragmatic hernia without obstruction or gangrene: Secondary | ICD-10-CM | POA: Insufficient documentation

## 2022-03-16 DIAGNOSIS — M4312 Spondylolisthesis, cervical region: Secondary | ICD-10-CM | POA: Insufficient documentation

## 2022-03-16 DIAGNOSIS — Z800 Family history of malignant neoplasm of digestive organs: Secondary | ICD-10-CM | POA: Diagnosis not present

## 2022-03-16 DIAGNOSIS — N838 Other noninflammatory disorders of ovary, fallopian tube and broad ligament: Secondary | ICD-10-CM

## 2022-03-16 DIAGNOSIS — D3911 Neoplasm of uncertain behavior of right ovary: Secondary | ICD-10-CM | POA: Diagnosis not present

## 2022-03-16 DIAGNOSIS — N9489 Other specified conditions associated with female genital organs and menstrual cycle: Secondary | ICD-10-CM

## 2022-03-16 DIAGNOSIS — Z7951 Long term (current) use of inhaled steroids: Secondary | ICD-10-CM | POA: Diagnosis not present

## 2022-03-16 DIAGNOSIS — Z803 Family history of malignant neoplasm of breast: Secondary | ICD-10-CM | POA: Diagnosis not present

## 2022-03-16 DIAGNOSIS — M810 Age-related osteoporosis without current pathological fracture: Secondary | ICD-10-CM | POA: Insufficient documentation

## 2022-03-16 DIAGNOSIS — J45909 Unspecified asthma, uncomplicated: Secondary | ICD-10-CM | POA: Diagnosis not present

## 2022-03-16 DIAGNOSIS — G8929 Other chronic pain: Secondary | ICD-10-CM | POA: Insufficient documentation

## 2022-03-16 DIAGNOSIS — F419 Anxiety disorder, unspecified: Secondary | ICD-10-CM | POA: Insufficient documentation

## 2022-03-16 DIAGNOSIS — Z79899 Other long term (current) drug therapy: Secondary | ICD-10-CM | POA: Diagnosis not present

## 2022-03-16 LAB — CEA (IN HOUSE-CHCC): CEA (CHCC-In House): 1.1 ng/mL (ref 0.00–5.00)

## 2022-03-16 MED ORDER — TRAMADOL HCL 50 MG PO TABS: 50.0000 mg | ORAL_TABLET | Freq: Four times a day (QID) | ORAL | 0 refills | Status: DC | PRN

## 2022-03-16 MED ORDER — SENNOSIDES-DOCUSATE SODIUM 8.6-50 MG PO TABS: 2.0000 | ORAL_TABLET | Freq: Every day | ORAL | 0 refills | Status: DC

## 2022-03-16 NOTE — H&P (View-Only) (Signed)
GYNECOLOGIC ONCOLOGY NEW PATIENT CONSULTATION   Patient Name: Mckenzie Collins  Patient Age: 81 y.o. Date of Service: 03/16/2022 Referring Provider: Asencion Noble, MD [address] [place],  [place] [address]   Primary Care Provider: Asencion Noble, MD Consulting Provider: Jeral Pinch, MD   Assessment/Plan:  Postmenopausal patient with complex pelvic mass.  I reviewed with the patient and her friend today findings from imaging.  We looked at CT images together.  I discussed imaging features on MRI which raise the concern for possible malignancy.  I stressed that imaging is not diagnostic.  The only way to make a definitive diagnosis is to proceed with surgical excision for pathology review.  Recommend that we proceed with surgery.  I think she is an excellent candidate for minimally invasive surgery.  We will plan to get tumor markers today including CEA and CA-125.  I discussed that neither of these tests are diagnostic.  Specifically, with CA-125, this is not approved as a diagnostic test for ovarian cancer.  It can be normal in up to 50% of early stage ovarian cancer and can be elevated in many noncancerous disease processes.  We will plan to proceed with robotic surgery.  Given her age as well as her family history, I recommend at minimum that we proceed with bilateral salpingo-oophorectomy.  Plan will be to remove the abnormal adnexa, decompress it in a contained manner, and send it for frozen section.  If benign, then no additional surgery will be performed.  If a borderline tumor, we discussed the risks and benefits of concurrent total hysterectomy as well as plan for peritoneal and omental biopsies.  If malignant, we would proceed with concurrent total hysterectomy and staging surgery.  Surgery would also include a mini laparotomy for intra-abdominal surface palpation.  If, the time of diagnostic laparoscopy, there are findings concerning for widely metastatic disease (despite no evidence  of this on CT scan), then I would make a determination about whether optimal cytoreduction is possible.  If it does not appear to be feasible, then I will biopsy to get a tissue diagnosis.  We reviewed that final pathology will take up to a week.  We discussed plan a robotic assisted bilateral salpingo-oophorectomy, possible total hysterectomy, possible staging, possible laparotomy. The risks of surgery were discussed in detail and she understands these to include infection; wound separation; hernia; vaginal cuff separation, injury to adjacent organs such as bowel, bladder, blood vessels, ureters and nerves; bleeding which may require blood transfusion; anesthesia risk; thromboembolic events; possible death; unforeseen complications; possible need for re-exploration; medical complications such as heart attack, stroke, pleural effusion and pneumonia; and, if full lymphadenectomy is performed the risk of lymphedema and lymphocyst. The patient will receive DVT and antibiotic prophylaxis as indicated. She voiced a clear understanding. She had the opportunity to ask questions. Perioperative instructions were reviewed with her. Prescriptions for post-op medications were sent to her pharmacy of choice.  Given her family history including first-degree relative with pancreatic cancer, I think that regardless of her diagnosis, she would benefit from meeting with one of our genetic counselors.  I will place this referral today.  A copy of this note was sent to the patient's referring provider.   65 minutes of total time was spent for this patient encounter, including preparation, face-to-face counseling with the patient and coordination of care, and documentation of the encounter.   Jeral Pinch, MD  Division of Gynecologic Oncology  Department of Obstetrics and Gynecology  [place] [facility] [place]  [facility]  ___________________________________________  Chief Complaint: Chief Complaint  Patient  presents with   Ovarian mass, right    History of Present Illness:  Mckenzie Collins is a 81 y.o. y.o. female who is seen in consultation at the request of Asencion Noble, MD for an evaluation of complex adnexal mass.  Patient presented to see her PCP in mid February secondary to persistent fatigue.  She also endorsed upper abdominal and back pain intermittently.  Had some nausea, denied emesis.  Had 3 pounds of weight gain.  For work-up of her upper abdominal pain, CT of the abdomen and pelvis performed on 01/17/2022 with findings of a 2.7 x 1.7 cm low-attenuation area in the right ovary.  Small hiatal hernia noted.  Hepatic and right renal hypodensities are too small to characterize, possibly cysts.  Small fat-containing umbilical hernia also noted.  Subsequently, pelvic ultrasound exam in 01/26/2022 showed a uterus measuring 9.3 x 6.2 x 3.4 cm.  Endometrial lining measures 3 mm.  Right ovary measures up to 3.5 cm, with comment that it is poorly visualized on imaging due to bowel.  Transabdominal imaging suggest presence of a septated cyst.  Left ovary measures up to 4.2 cm and is normal in morphology.  No free fluid noted.  MRI of the pelvis in mid April showed a uterus measuring 5.8 x 2.9 x 3.8 cm with a small less than 1 cm fibroid noted.  Right ovary found to have a complex cystic mass with numerous thin internal septations and mild soft tissue thickening.  Mass measures up to 3.4 cm.  Left ovary normal appearing.  Small amount of free fluid noted in the right adnexa and cul-de-sac.  No adenopathy or other findings of metastatic disease.  Today, patient reports feeling well.  She has had 3 weeks of cough symptoms.  This was after she developed a sore throat.  She otherwise feels good.  She denies any abdominal or pelvic pain.  She denies any pelvic pressure, fullness, or abdominal bloating.  She endorses a good appetite without nausea or emesis.  She endorses normal bowel and bladder function.  If her  bladder is filled, has intermittent stress urinary incontinence.  She denies any changes in weight recently.  Endorses some chronic intermittent back and neck pain.  She denies any vaginal bleeding, has baseline slight vaginal discharge.  She comes in with a friend today who is a retired Marine scientist.  PAST MEDICAL HISTORY:  Past Medical History:  Diagnosis Date   Anxiety    Asthma    uses symbicort, uses albuterol maybe once a month   Breast mass left   skin lesion around 11-12 x 2 -3 weeks   Chest pain    history of   Dyspnea 2005   Negative stress nuclear in 2005; normal echocardiogram   Gastroesophageal reflux disease    hiatal hernia; gastritis; dysphagia   Hemorrhoids    Inverted nipple left   always   Low back pain    Microscopic hematuria    Osteoporosis    Palpitations 2003   few short runs of supraventricular tachycardia on Holter   Spondylolisthesis of cervical region      PAST SURGICAL HISTORY:  Past Surgical History:  Procedure Laterality Date   CATARACT EXTRACTION Bilateral    COLONOSCOPY  2011   COLONOSCOPY WITH PROPOFOL N/A 12/08/2020   Procedure: COLONOSCOPY WITH PROPOFOL;  Surgeon: Rogene Houston, MD;  Location: [facility];  Service: Endoscopy;  Laterality: N/A;  8:30   TONSILLECTOMY      OB/GYN HISTORY:  OB History  Gravida Para Term Preterm AB Living  '2 2 2     2  '$ SAB IAB Ectopic Multiple Live Births          2    # Outcome Date GA Lbr Len/2nd Weight Sex Delivery Anes PTL Lv  2 Term 09/30/75 [redacted]w[redacted]d  F    LIV  1 Term 11/15/72 [id number] M    LIV    No LMP recorded. Patient is postmenopausal.  Age at menarche: [age] at menopause: Unsure Hx of HRT: Denies Hx of STDs: Denies Last pap: 2020 History of abnormal pap smears: denies  SCREENING STUDIES:  Last mammogram: 2013  Last colonoscopy: 2022  MEDICATIONS: Outpatient Encounter Medications as of 03/16/2022  Medication Sig   albuterol (VENTOLIN HFA) 108 (90 Base) MCG/ACT inhaler INL 2  INHALATION PO BID   senna-docusate (SENOKOT-S) 8.6-50 MG tablet Take 2 tablets by mouth at bedtime. For AFTER surgery, do not take if having diarrhea   traMADol (ULTRAM) 50 MG tablet Take 1 tablet (50 mg total) by mouth every 6 (six) hours as needed for severe pain. For AFTER surgery only, do not take and drive   budesonide-formoterol (SYMBICORT) 80-4.5 MCG/ACT inhaler Inhale 2 puffs into the lungs 2 (two) times daily. (Patient taking differently: Inhale 1 puff into the lungs every morning.)   calcium citrate-vitamin D (CITRACAL+D) 315-200 MG-UNIT tablet Take 1 tablet by mouth daily.   calcium elemental as carbonate (TUMS ULTRA 1000) 400 MG chewable tablet Chew 1,000 mg by mouth daily as needed for heartburn.   Cholecalciferol (VITAMIN D PO) Take 1,000 Units by mouth daily.   Multiple Vitamins-Minerals (CENTRUM SILVER ULTRA WOMENS PO) Take 1 tablet by mouth daily.   Propylene Glycol (SYSTANE BALANCE) 0.6 % SOLN Place 1 drop into both eyes daily as needed (dry eyes).   [DISCONTINUED] denosumab (PROLIA) 60 MG/ML SOSY injection Inject 60 mg into the skin every 6 (six) months.   [DISCONTINUED] metoprolol tartrate (LOPRESSOR) 50 MG tablet Take 1 tablets two (2) hours prior to CT Scan   [DISCONTINUED] pantoprazole (PROTONIX) 40 MG tablet Take 1 tablet (40 mg total) by mouth daily. Take 30-60 min before first meal of the day   No facility-administered encounter medications on file as of 03/16/2022.    ALLERGIES:  No Known Allergies   FAMILY HISTORY:  Family History  Problem Relation Age of Onset   Cancer Father        pancreatic   Alzheimer's disease Father    Alzheimer's disease Sister    Breast cancer Paternal Aunt        5066s Alzheimer's disease Paternal Grandfather    Congenital heart disease Brother      SOCIAL HISTORY:  Social Connections: Socially Integrated   Frequency of Communication with Friends and Family: More than three times a week   Frequency of Social Gatherings with  Friends and Family: Twice a week   Attends Religious Services: More than 4 times per year   Active Member of [employer] Organizations: Yes   Attends [employer] therapistMore than 4 times per year   Marital Status: Married    REVIEW OF SYSTEMS:  +cough Denies appetite changes, fevers, chills, fatigue, unexplained weight changes. Denies hearing loss, neck lumps or masses, mouth sores, ringing in ears or voice changes. Denies wheezing.  Denies shortness of breath. Denies chest pain or palpitations. Denies leg  swelling. Denies abdominal distention, pain, blood in stools, constipation, diarrhea, nausea, vomiting, or early satiety. Denies pain with intercourse, dysuria, frequency, hematuria or incontinence. Denies hot flashes, pelvic pain, vaginal bleeding or vaginal discharge.   Denies joint pain, back pain or muscle pain/cramps. Denies itching, rash, or wounds. Denies dizziness, headaches, numbness or seizures. Denies swollen lymph nodes or glands, denies easy bruising or bleeding. Denies anxiety, depression, confusion, or decreased concentration.  Physical Exam:  Vital Signs for this encounter:  Blood pressure (!) 139/59, pulse 87, temperature 98.4 F (36.9 C), temperature source Oral, resp. rate 18, height '5\' 3"'$  (1.6 m), weight 134 lb (60.8 kg), SpO2 98 %. Body mass index is 23.74 kg/m. General: Alert, oriented, no acute distress.  HEENT: Normocephalic, atraumatic. Sclera anicteric.  Chest: Clear to auscultation bilaterally. No wheezes, rhonchi, or rales. Cardiovascular: Regular rate and rhythm, no murmurs, rubs, or gallops.  Abdomen: Normoactive bowel sounds. Soft, nondistended, nontender to palpation. No masses or hepatosplenomegaly appreciated. No palpable fluid wave.  Extremities: Grossly normal range of motion. Warm, well perfused. No edema bilaterally.  Skin: No rashes or lesions.  Lymphatics: No cervical, supraclavicular, or inguinal adenopathy.  GU:  Normal external  female genitalia.   No lesions. No discharge or bleeding.             Bladder/urethra:  No lesions or masses, well supported bladder             Vagina: Mildly atrophic, no lesions or masses noted, no bleeding or discharge.             Cervix: Normal appearing, no lesions.             Uterus: Small, mobile, no parametrial involvement or nodularity.             Adnexa: Fullness appreciated in the cul-de-sac, more on the right, smooth, mobile.   Rectal: Confirms above findings.  LABORATORY AND RADIOLOGIC DATA:  Outside medical records were reviewed to synthesize the above history, along with the history and physical obtained during the visit.   Lab Results  Component Value Date   WBC 6.4 01/08/2018   HGB 13.5 01/08/2018   HCT 39.5 01/08/2018   PLT 265.0 01/08/2018   GLUCOSE 161 (H) 11/16/2021   NA 135 11/16/2021   K 3.8 11/16/2021   CL 102 11/16/2021   CREATININE 0.90 01/17/2022   BUN 25 (H) 11/16/2021   CO2 23 11/16/2021   TSH 2.17 01/08/2018

## 2022-03-16 NOTE — Patient Instructions (Signed)
Preparing for your Surgery ?  ?Plan for surgery on Apr 12, 2022 with Dr. Jeral Pinch at [place] will be scheduled for robotic assisted laparoscopic bilateral salpingo-oophorectomy (removal of both ovaries and fallopian tubes), possible robotic assisted total laparoscopic hysterectomy (removal of the uterus and cervix), possible staging if a cancer if identified, possible laparotomy (larger incision on your abdomen if needed).  ?  ?Pre-operative Testing ?-You will receive a phone call from presurgical testing at [facility] to arrange for a pre-operative appointment and lab work. ?  ?-Bring your insurance card, copy of an advanced directive if applicable, medication list ?  ?-At that visit, you will be asked to sign a consent for a possible blood transfusion in case a transfusion becomes necessary during surgery.  The need for a blood transfusion is rare but having consent is a necessary part of your care.    ?  ?-You should not be taking blood thinners or aspirin at least ten days prior to surgery unless instructed by your surgeon. ?  ?-Do not take supplements such as fish oil (omega 3), red yeast rice, turmeric before your surgery. You want to avoid medications with aspirin in them including headache powders such as BC or Goody's), Excedrin migraine. ?  ?Day Before Surgery at Home ?-You will be asked to take in a light diet the day before surgery. You will be advised you can have clear liquids up until 3 hours before your surgery.   ?  ?Eat a light diet the day before surgery.  Examples including soups, broths, toast, yogurt, mashed potatoes.  AVOID GAS PRODUCING FOODS. Things to avoid include carbonated beverages (fizzy beverages, sodas), raw fruits and raw vegetables (uncooked), or beans.  ?  ?If your bowels are filled with gas, your surgeon will have difficulty visualizing your pelvic organs which increases your surgical risks. ?  ?Your role in recovery ?Your role is to become  active as soon as directed by your doctor, while still giving yourself time to heal.  Rest when you feel tired. You will be asked to do the following in order to speed your recovery: ?  ?- Cough and breathe deeply. This helps to clear and expand your lungs and can prevent pneumonia after surgery.  ?- STAY ACTIVE WHEN YOU GET HOME. Do mild physical activity. Walking or moving your legs help your circulation and body functions return to normal. Do not try to get up or walk alone the first time after surgery.   ?-If you develop swelling on one leg or the other, pain in the back of your leg, redness/warmth in one of your legs, please call the office or go to the Emergency Room to have a doppler to rule out a blood clot. For shortness of breath, chest pain-seek care in the Emergency Room as soon as possible. ?- Actively manage your pain. Managing your pain lets you move in comfort. We will ask you to rate your pain on a scale of zero to 10. It is your responsibility to tell your doctor or nurse where and how much you hurt so your pain can be treated. ?  ?Special Considerations ?-If you are diabetic, you may be placed on insulin after surgery to have closer control over your blood sugars to promote healing and recovery.  This does not mean that you will be discharged on insulin.  If applicable, your oral antidiabetics will be resumed when you are tolerating a solid diet. ?  ?-Your final  pathology results from surgery should be available around one week after surgery and the results will be relayed to you when available. ?  ?-FMLA forms can be faxed to [phone] and please allow 5-7 business days for completion. ?  ?Pain Management After Surgery ?-You have been prescribed your pain medication (tramadol) and bowel regimen medications before surgery so that you can have these available when you are discharged from the hospital. The pain medication is for use ONLY AFTER surgery and a new prescription will not be given.  ?   ?-Make sure that you have Tylenol and Ibuprofen at home IF [place] to use on a regular basis after surgery for pain control. We recommend alternating the medications every hour to six hours since they work differently and are processed in the body differently for pain relief. ?  ?-Review the attached handout on narcotic use and their risks and side effects.  ?  ?Bowel Regimen ?-You have been prescribed Sennakot-S to take nightly to prevent constipation especially if you are taking the narcotic pain medication intermittently.  It is important to prevent constipation and drink adequate amounts of liquids. You can stop taking this medication when you are not taking pain medication and you are back on your normal bowel routine. ?  ?Risks of Surgery ?Risks of surgery are low but include bleeding, infection, damage to surrounding structures, re-operation, blood clots, and very rarely death. ?  ?  ?Blood Transfusion Information (For the consent to be signed before surgery) ?  ?We will be checking your blood type before surgery so in case of emergencies, we will know what type of blood you would need. ?  ?                                          WHAT IS A BLOOD TRANSFUSION? ?  ?A transfusion is the replacement of blood or some of its parts. Blood is made up of multiple cells which provide different functions. ?Red blood cells carry oxygen and are used for blood loss replacement. ?White blood cells fight against infection. ?Platelets control bleeding. ?Plasma helps clot blood. ?Other blood products are available for specialized needs, such as hemophilia or other clotting disorders. ?BEFORE THE TRANSFUSION  ?Who gives blood for transfusions?  ?You may be able to donate blood to be used at a later date on yourself (autologous donation). ?Relatives can be asked to donate blood. This is generally not any safer than if you have received blood from a stranger. The same precautions are taken to  ensure safety when a relative's blood is donated. ?Healthy volunteers who are fully evaluated to make sure their blood is safe. This is blood bank blood. ?Transfusion therapy is the safest it has ever been in the practice of medicine. Before blood is taken from a donor, a complete history is taken to make sure that person has no history of diseases nor engages in risky social behavior (examples are intravenous drug use or sexual activity with multiple partners). The donor's travel history is screened to minimize risk of transmitting infections, such as malaria. The donated blood is tested for signs of infectious diseases, such as HIV and hepatitis. The blood is then tested to be sure it is compatible with you in order to minimize the chance of a transfusion reaction. If you or a relative donates blood, this  is often done in anticipation of surgery and is not appropriate for emergency situations. It takes many days to process the donated blood. ?RISKS AND COMPLICATIONS ?Although transfusion therapy is very safe and saves many lives, the main dangers of transfusion include:  ?Getting an infectious disease. ?Developing a transfusion reaction. This is an allergic reaction to something in the blood you were given. Every precaution is taken to prevent this. ?The decision to have a blood transfusion has been considered carefully by your caregiver before blood is given. Blood is not given unless the benefits outweigh the risks. ?  ?AFTER SURGERY INSTRUCTIONS ?  ?Return to work: 4-6 weeks if applicable ?  ?Activity: ?1. Be up and out of the bed during the day.  Take a nap if needed.  You may walk up steps but be careful and use the hand rail.  Stair climbing will tire you more than you think, you may need to stop part way and rest.  ?  ?2. No lifting or straining for 6 weeks over 10 pounds. No pushing, pulling, straining for 6 weeks. ?  ?3. No driving for around 1 week(s).  Do not drive if you are taking narcotic pain  medicine and make sure that your reaction time has returned.  ?  ?4. You can shower as soon as the next day after surgery. Shower daily.  Use your regular soap and water (not directly on the incision) and pat your

## 2022-03-16 NOTE — Patient Instructions (Addendum)
Preparing for your Surgery ? ?Plan for surgery on Apr 12, 2022 with Dr. Jeral Pinch at [place] will be scheduled for robotic assisted laparoscopic bilateral salpingo-oophorectomy (removal of both ovaries and fallopian tubes), possible robotic assisted total laparoscopic hysterectomy (removal of the uterus and cervix), possible staging if a cancer if identified, possible laparotomy (larger incision on your abdomen if needed).  ? ?Pre-operative Testing ?-You will receive a phone call from presurgical testing at [facility] to arrange for a pre-operative appointment and lab work. ? ?-Bring your insurance card, copy of an advanced directive if applicable, medication list ? ?-At that visit, you will be asked to sign a consent for a possible blood transfusion in case a transfusion becomes necessary during surgery.  The need for a blood transfusion is rare but having consent is a necessary part of your care.    ? ?-You should not be taking blood thinners or aspirin at least ten days prior to surgery unless instructed by your surgeon. ? ?-Do not take supplements such as fish oil (omega 3), red yeast rice, turmeric before your surgery. You want to avoid medications with aspirin in them including headache powders such as BC or Goody's), Excedrin migraine. ? ?Day Before Surgery at Home ?-You will be asked to take in a light diet the day before surgery. You will be advised you can have clear liquids up until 3 hours before your surgery.   ? ?Eat a light diet the day before surgery.  Examples including soups, broths, toast, yogurt, mashed potatoes.  AVOID GAS PRODUCING FOODS. Things to avoid include carbonated beverages (fizzy beverages, sodas), raw fruits and raw vegetables (uncooked), or beans.  ? ?If your bowels are filled with gas, your surgeon will have difficulty visualizing your pelvic organs which increases your surgical risks. ? ?Your role in recovery ?Your role is to become active as  soon as directed by your doctor, while still giving yourself time to heal.  Rest when you feel tired. You will be asked to do the following in order to speed your recovery: ? ?- Cough and breathe deeply. This helps to clear and expand your lungs and can prevent pneumonia after surgery.  ?- STAY ACTIVE WHEN YOU GET HOME. Do mild physical activity. Walking or moving your legs help your circulation and body functions return to normal. Do not try to get up or walk alone the first time after surgery.   ?-If you develop swelling on one leg or the other, pain in the back of your leg, redness/warmth in one of your legs, please call the office or go to the Emergency Room to have a doppler to rule out a blood clot. For shortness of breath, chest pain-seek care in the Emergency Room as soon as possible. ?- Actively manage your pain. Managing your pain lets you move in comfort. We will ask you to rate your pain on a scale of zero to 10. It is your responsibility to tell your doctor or nurse where and how much you hurt so your pain can be treated. ? ?Special Considerations ?-If you are diabetic, you may be placed on insulin after surgery to have closer control over your blood sugars to promote healing and recovery.  This does not mean that you will be discharged on insulin.  If applicable, your oral antidiabetics will be resumed when you are tolerating a solid diet. ? ?-Your final pathology results from surgery should be available around one week after surgery and  the results will be relayed to you when available. ? ?-FMLA forms can be faxed to [phone] and please allow 5-7 business days for completion. ? ?Pain Management After Surgery ?-You have been prescribed your pain medication (tramadol) and bowel regimen medications before surgery so that you can have these available when you are discharged from the hospital. The pain medication is for use ONLY AFTER surgery and a new prescription will not be given.  ? ?-Make sure  that you have Tylenol and Ibuprofen at home IF [place] to use on a regular basis after surgery for pain control. We recommend alternating the medications every hour to six hours since they work differently and are processed in the body differently for pain relief. ? ?-Review the attached handout on narcotic use and their risks and side effects.  ? ?Bowel Regimen ?-You have been prescribed Sennakot-S to take nightly to prevent constipation especially if you are taking the narcotic pain medication intermittently.  It is important to prevent constipation and drink adequate amounts of liquids. You can stop taking this medication when you are not taking pain medication and you are back on your normal bowel routine. ? ?Risks of Surgery ?Risks of surgery are low but include bleeding, infection, damage to surrounding structures, re-operation, blood clots, and very rarely death. ? ? ?Blood Transfusion Information (For the consent to be signed before surgery) ? ?We will be checking your blood type before surgery so in case of emergencies, we will know what type of blood you would need. ? ?                                          WHAT IS A BLOOD TRANSFUSION? ? ?A transfusion is the replacement of blood or some of its parts. Blood is made up of multiple cells which provide different functions. ?Red blood cells carry oxygen and are used for blood loss replacement. ?White blood cells fight against infection. ?Platelets control bleeding. ?Plasma helps clot blood. ?Other blood products are available for specialized needs, such as hemophilia or other clotting disorders. ?BEFORE THE TRANSFUSION  ?Who gives blood for transfusions?  ?You may be able to donate blood to be used at a later date on yourself (autologous donation). ?Relatives can be asked to donate blood. This is generally not any safer than if you have received blood from a stranger. The same precautions are taken to ensure safety when a  relative's blood is donated. ?Healthy volunteers who are fully evaluated to make sure their blood is safe. This is blood bank blood. ?Transfusion therapy is the safest it has ever been in the practice of medicine. Before blood is taken from a donor, a complete history is taken to make sure that person has no history of diseases nor engages in risky social behavior (examples are intravenous drug use or sexual activity with multiple partners). The donor's travel history is screened to minimize risk of transmitting infections, such as malaria. The donated blood is tested for signs of infectious diseases, such as HIV and hepatitis. The blood is then tested to be sure it is compatible with you in order to minimize the chance of a transfusion reaction. If you or a relative donates blood, this is often done in anticipation of surgery and is not appropriate for emergency situations. It takes many days to process the donated blood. ?RISKS  AND COMPLICATIONS ?Although transfusion therapy is very safe and saves many lives, the main dangers of transfusion include:  ?Getting an infectious disease. ?Developing a transfusion reaction. This is an allergic reaction to something in the blood you were given. Every precaution is taken to prevent this. ?The decision to have a blood transfusion has been considered carefully by your caregiver before blood is given. Blood is not given unless the benefits outweigh the risks. ? ?AFTER SURGERY INSTRUCTIONS ? ?Return to work: 4-6 weeks if applicable ? ?Activity: ?1. Be up and out of the bed during the day.  Take a nap if needed.  You may walk up steps but be careful and use the hand rail.  Stair climbing will tire you more than you think, you may need to stop part way and rest.  ? ?2. No lifting or straining for 6 weeks over 10 pounds. No pushing, pulling, straining for 6 weeks. ? ?3. No driving for around 1 week(s).  Do not drive if you are taking narcotic pain medicine and make sure that your  reaction time has returned.  ? ?4. You can shower as soon as the next day after surgery. Shower daily.  Use your regular soap and water (not directly on the incision) and pat your incision(s) dry afterwards; d

## 2022-03-16 NOTE — Progress Notes (Signed)
GYNECOLOGIC ONCOLOGY NEW PATIENT CONSULTATION  ? ?Patient Name: Mckenzie Collins  ?Patient Age: 81 y.o. ?Date of Service: 03/16/2022 ?Referring Provider: Asencion Noble, MD ?[address] ?[place],  [place] [address]  ? ?Primary Care Provider: Asencion Noble, MD ?Consulting Provider: Jeral Pinch, MD  ? ?Assessment/Plan:  ?Postmenopausal patient with complex pelvic mass. ? ?I reviewed with the patient and her friend today findings from imaging.  We looked at CT images together.  I discussed imaging features on MRI which raise the concern for possible malignancy.  I stressed that imaging is not diagnostic.  The only way to make a definitive diagnosis is to proceed with surgical excision for pathology review.  Recommend that we proceed with surgery.  I think she is an excellent candidate for minimally invasive surgery. ? ?We will plan to get tumor markers today including CEA and CA-125.  I discussed that neither of these tests are diagnostic.  Specifically, with CA-125, this is not approved as a diagnostic test for ovarian cancer.  It can be normal in up to 50% of early stage ovarian cancer and can be elevated in many noncancerous disease processes. ? ?We will plan to proceed with robotic surgery.  Given her age as well as her family history, I recommend at minimum that we proceed with bilateral salpingo-oophorectomy.  Plan will be to remove the abnormal adnexa, decompress it in a contained manner, and send it for frozen section.  If benign, then no additional surgery will be performed.  If a borderline tumor, we discussed the risks and benefits of concurrent total hysterectomy as well as plan for peritoneal and omental biopsies.  If malignant, we would proceed with concurrent total hysterectomy and staging surgery.  Surgery would also include a mini laparotomy for intra-abdominal surface palpation.  If, the time of diagnostic laparoscopy, there are findings concerning for widely metastatic disease (despite no evidence  of this on CT scan), then I would make a determination about whether optimal cytoreduction is possible.  If it does not appear to be feasible, then I will biopsy to get a tissue diagnosis. ? ?We reviewed that final pathology will take up to a week. ? ?We discussed plan a robotic assisted bilateral salpingo-oophorectomy, possible total hysterectomy, possible staging, possible laparotomy. The risks of surgery were discussed in detail and she understands these to include infection; wound separation; hernia; vaginal cuff separation, injury to adjacent organs such as bowel, bladder, blood vessels, ureters and nerves; bleeding which may require blood transfusion; anesthesia risk; thromboembolic events; possible death; unforeseen complications; possible need for re-exploration; medical complications such as heart attack, stroke, pleural effusion and pneumonia; and, if full lymphadenectomy is performed the risk of lymphedema and lymphocyst. The patient will receive DVT and antibiotic prophylaxis as indicated. She voiced a clear understanding. She had the opportunity to ask questions. Perioperative instructions were reviewed with her. Prescriptions for post-op medications were sent to her pharmacy of choice. ? ?Given her family history including first-degree relative with pancreatic cancer, I think that regardless of her diagnosis, she would benefit from meeting with one of our genetic counselors.  I will place this referral today. ? ?A copy of this note was sent to the patient's referring provider.  ? ?65 minutes of total time was spent for this patient encounter, including preparation, face-to-face counseling with the patient and coordination of care, and documentation of the encounter. ? ? ?Jeral Pinch, MD  ?Division of Gynecologic Oncology  ?Department of Obstetrics and Gynecology  ?[facility]  ?___________________________________________  ?Chief Complaint: ?Chief Complaint  ?Patient  presents with  ? Ovarian mass, right  ? ? ?History of Present Illness:  ?Mckenzie Collins is a 81 y.o. y.o. female who is seen in consultation at the request of Asencion Noble, MD for an evaluation of complex adnexal mass. ? ?Patient presented to see her PCP in mid February secondary to persistent fatigue.  She also endorsed upper abdominal and back pain intermittently.  Had some nausea, denied emesis.  Had 3 pounds of weight gain.  For work-up of her upper abdominal pain, CT of the abdomen and pelvis performed on 01/17/2022 with findings of a 2.7 x 1.7 cm low-attenuation area in the right ovary.  Small hiatal hernia noted.  Hepatic and right renal hypodensities are too small to characterize, possibly cysts.  Small fat-containing umbilical hernia also noted. ? ?Subsequently, pelvic ultrasound exam in 01/26/2022 showed a uterus measuring 9.3 x 6.2 x 3.4 cm.  Endometrial lining measures 3 mm.  Right ovary measures up to 3.5 cm, with comment that it is poorly visualized on imaging due to bowel.  Transabdominal imaging suggest presence of a septated cyst.  Left ovary measures up to 4.2 cm and is normal in morphology.  No free fluid noted. ? ?MRI of the pelvis in mid April showed a uterus measuring 5.8 x 2.9 x 3.8 cm with a small less than 1 cm fibroid noted.  Right ovary found to have a complex cystic mass with numerous thin internal septations and mild soft tissue thickening.  Mass measures up to 3.4 cm.  Left ovary normal appearing.  Small amount of free fluid noted in the right adnexa and cul-de-sac.  No adenopathy or other findings of metastatic disease. ? ?Today, patient reports feeling well.  She has had 3 weeks of cough symptoms.  This was after she developed a sore throat.  She otherwise feels good.  She denies any abdominal or pelvic pain.  She denies any pelvic pressure, fullness, or abdominal bloating.  She endorses a good appetite without nausea or emesis.  She endorses normal bowel and bladder function.  If her  bladder is filled, has intermittent stress urinary incontinence.  She denies any changes in weight recently.  Endorses some chronic intermittent back and neck pain. ? ?She denies any vaginal bleeding, has baseline slight vaginal discharge. ? ?She comes in with a friend today who is a retired [employer] scientist. ? ?PAST MEDICAL HISTORY:  ?Past Medical History:  ?Diagnosis Date  ? Anxiety   ? Asthma   ? uses symbicort, uses albuterol maybe once a month  ? Breast mass left  ? skin lesion around 11-12 x 2 -3 weeks  ? Chest pain   ? history of  ? Dyspnea 2005  ? Negative stress nuclear in 2005; normal echocardiogram  ? Gastroesophageal reflux disease   ? hiatal hernia; gastritis; dysphagia  ? Hemorrhoids   ? Inverted nipple left  ? always  ? Low back pain   ? Microscopic hematuria   ? Osteoporosis   ? Palpitations 2003  ? few short runs of supraventricular tachycardia on Holter  ? Spondylolisthesis of cervical region   ?  ? ?PAST SURGICAL HISTORY:  ?Past Surgical History:  ?Procedure Laterality Date  ? CATARACT EXTRACTION Bilateral   ? COLONOSCOPY  2011  ? COLONOSCOPY WITH PROPOFOL N/A 12/08/2020  ? Procedure: COLONOSCOPY WITH PROPOFOL;  Surgeon: Rogene Houston, MD;  Location: [facility];  Service: Endoscopy;  Laterality: N/A;  8:30  ? TONSILLECTOMY    ? ? ?OB/GYN HISTORY:  ?OB History  ?Gravida Para Term Preterm AB Living  ?'2 2 2     2  '$ ?SAB IAB Ectopic Multiple Live Births  ?        2  ?  ?# Outcome Date GA Lbr Len/2nd Weight Sex Delivery Anes PTL Lv  ?2 Term 09/30/75 [redacted]w[redacted]d  F    LIV  ?1 Term 11/15/72 [id number] M    LIV  ? ? ?No LMP recorded. Patient is postmenopausal. ? ?Age at menarche: [age]Age at menopause: Unsure ?Hx of HRT: Denies ?Hx of STDs: Denies ?Last pap: 2020 ?History of abnormal pap smears: denies ? ?SCREENING STUDIES:  ?Last mammogram: 2013  ?Last colonoscopy: 2022 ? ?MEDICATIONS: ?Outpatient Encounter Medications as of 03/16/2022  ?Medication Sig  ? albuterol (VENTOLIN HFA) 108 (90 Base) MCG/ACT inhaler INL 2  INHALATION PO BID  ? senna-docusate (SENOKOT-S) 8.6-50 MG tablet Take 2 tablets by mouth at bedtime. For AFTER surgery, do not take if having diarrhea  ? traMADol (ULTRAM) 50 MG tablet Take 1 tablet (50 mg total)

## 2022-03-16 NOTE — Progress Notes (Signed)
Patient here for new patient consultation with Dr. Jeral Pinch and for a pre-operative discussion prior to her scheduled surgery on Apr 12, 2022. She is scheduled for robotic assisted laparoscopic bilateral salpingo-oophorectomy, possible robotic assisted total laparoscopic hysterectomy, possible staging if a cancer if identified, possible laparotomy. The surgery was discussed in detail.  See after visit summary for additional details. Visual aids used to discuss items related to surgery including the incentive spirometer, sequential compression stockings, foley catheter, IV pump, multi-modal pain regimen including tylenol, photo of the surgical robot, female reproductive system to discuss surgery in detail.    ?  ?Discussed post-op pain management in detail including the aspects of the enhanced recovery pathway.  Advised her that a new prescription would be sent in for tramadol and it is only to be used for after her upcoming surgery.  We discussed the use of tylenol post-op and to monitor for a maximum of 4,000 mg in a 24 hour period.  Also prescribed sennakot to be used after surgery and to hold if having loose stools.  Discussed bowel regimen in detail.   ?  ?Discussed the use of SCDs and measures to take at home to prevent DVT including frequent mobility.  Reportable signs and symptoms of DVT discussed. Post-operative instructions discussed and expectations for after surgery. Incisional care discussed as well including reportable signs and symptoms including erythema, drainage, wound separation.  ?   ?10 minutes spent with the patient.  Verbalizing understanding of material discussed. No needs or concerns voiced at the end of the visit.   Advised patient to call for any needs.  Advised that her post-operative medications had been prescribed and could be picked up at any time.   ? ?This appointment is included in the global surgical bundle as pre-operative teaching and has no charge.     ?

## 2022-03-18 LAB — CA 125: Cancer Antigen (CA) 125: 8.4 U/mL (ref 0.0–38.1)

## 2022-03-20 ENCOUNTER — Telehealth: Payer: Self-pay

## 2022-03-20 NOTE — Telephone Encounter (Addendum)
Attempted to call pt regarding her CA125 results. Unable to reach at this time. Will try again later today.  ?5/9. Spoke to pt regarding normal CA125 results. She verbalized an understanding.  ?KJ CMA ?

## 2022-03-24 ENCOUNTER — Telehealth: Payer: Self-pay | Admitting: *Deleted

## 2022-03-24 ENCOUNTER — Telehealth: Payer: Self-pay | Admitting: Gynecologic Oncology

## 2022-03-24 NOTE — Telephone Encounter (Signed)
Spoke with pt this afternoon who stated that someone called her about her June 22nd appt and left a message on her voicemail but she could not understand or hear them well. Informed pt it might be related to her genetic testing. I'll reach out to the genetic counselor to have them give her a call. She was agreeable to that. Informed her if she needed anything else to give our office a call. She verbalized understanding.  ?

## 2022-03-24 NOTE — Telephone Encounter (Signed)
.  Called patient to schedule appointment per 5/11 inbasket, patient is aware of date and time.   ?

## 2022-03-27 ENCOUNTER — Telehealth: Payer: Self-pay | Admitting: Genetic Counselor

## 2022-03-27 NOTE — Telephone Encounter (Signed)
Answered patient questions about purpose of genetic counseling and costs associated with appt/testing.  ?

## 2022-03-30 NOTE — Progress Notes (Addendum)
COVID Vaccine Completed: yes x3  Date of COVID positive in last 90 days: no  PCP - Asencion Noble, MD Cardiologist -Candee Furbish, MD  Chest x-ray - 08/01/21 Epic  EKG - 10/17/21 Epic Stress Test - n/a ECHO - 12/06/21 Epic Cardiac Cath - n/a Pacemaker/ICD device last checked: n/a Spinal Cord Stimulator: n/a  Bowel Prep - light diet the day before  Sleep Study - n/a CPAP -   Fasting Blood Sugar - n/a Checks Blood Sugar _____ times a day  Blood Thinner Instructions: n/a Aspirin Instructions: Last Dose:  Activity level: Can go up a flight of stairs and perform activities of daily living without stopping and without symptoms of chest pain or shortness of breath.       Anesthesia review:   Patient denies shortness of breath, fever, cough and chest pain at PAT appointment   Patient verbalized understanding of instructions that were given to them at the PAT appointment. Patient was also instructed that they will need to review over the PAT instructions again at home before surgery.

## 2022-03-30 NOTE — Patient Instructions (Addendum)
DUE TO COVID-19 ONLY TWO VISITORS  (aged 79 and older)  ARE ALLOWED TO COME WITH YOU AND STAY IN THE WAITING ROOM ONLY DURING PRE OP AND PROCEDURE.   **NO VISITORS ARE ALLOWED IN THE SHORT STAY AREA OR RECOVERY ROOM!!**   Your procedure is scheduled on: 03/15/22   Report to [facility] Main Entrance    Report to admitting at 11:45 AM   Call this number if you have problems the morning of surgery [phone]   Do not eat food :After Midnight.   After Midnight you may have the following liquids until 11:00 AM DAY OF SURGERY  Water Black Coffee (sugar ok, NO MILK/CREAM OR CREAMERS)  Tea (sugar ok, NO MILK/CREAM OR CREAMERS) regular and decaf                             Plain Jell-O (NO RED)                                           Fruit ices (not with fruit pulp, NO RED)                                     Popsicles (NO RED)                                                                  Juice: apple, WHITE grape, WHITE cranberry Sports drinks like Gatorade (NO RED) Clear broth(vegetable,chicken,beef)                The day of surgery:  Drink ONE (1) Pre-Surgery Clear Ensure at 11:00 AM the morning of surgery. Drink in one sitting. Do not sip.  This drink was given to you during your hospital  pre-op appointment visit. Nothing else to drink after completing the  Pre-Surgery Clear Ensure.          If you have questions, please contact your surgeon's office.   FOLLOW BOWEL PREP AND ANY ADDITIONAL PRE OP INSTRUCTIONS YOU RECEIVED FROM YOUR SURGEON'S OFFICE!!!     Oral Hygiene is also important to reduce your risk of infection.                                    Remember - BRUSH YOUR TEETH THE MORNING OF SURGERY WITH YOUR REGULAR TOOTHPASTE   Take these medicines the morning of surgery with A SIP OF WATER: Tylenol, Inhalers, Pepcid                              You may not have any metal on your body including hair pins, jewelry, and body piercing             Do not  wear make-up, lotions, powders, perfumes, or deodorant  Do not wear nail polish including gel and S&S, artificial/acrylic nails, or any other type of covering on natural nails including finger and  toenails. If you have artificial nails, gel coating, etc. that needs to be removed by a nail salon please have this removed prior to surgery or surgery may need to be canceled/ delayed if the surgeon/ anesthesia feels like they are unable to be safely monitored.   Do not shave  48 hours prior to surgery.    Do not bring valuables to the hospital. [place].   Patients discharged on the day of surgery will not be allowed to drive home.  Someone NEEDS to stay with you for the first 24 hours after anesthesia.   Special Instructions: Bring a copy of your healthcare power of attorney and living will documents         the day of surgery if you haven't scanned them before.              Please read over the following fact sheets you were given: IF YOU HAVE QUESTIONS ABOUT YOUR PRE-OP INSTRUCTIONS PLEASE CALL [facility] - Preparing for Surgery Before surgery, you can play an important role.  Because skin is not sterile, your skin needs to be as free of germs as possible.  You can reduce the number of germs on your skin by washing with CHG (chlorahexidine gluconate) soap before surgery.  CHG is an antiseptic cleaner which kills germs and bonds with the skin to continue killing germs even after washing. Please DO NOT use if you have an allergy to CHG or antibacterial soaps.  If your skin becomes reddened/irritated stop using the CHG and inform your nurse when you arrive at Short Stay. Do not shave (including legs and underarms) for at least 48 hours prior to the first CHG shower.  You may shave your face/neck.  Please follow these instructions carefully:  1.  Shower with CHG Soap the night before surgery and the  morning of surgery.  2.   If you choose to wash your hair, wash your hair first as usual with your normal  shampoo.  3.  After you shampoo, rinse your hair and body thoroughly to remove the shampoo.                             4.  Use CHG as you would any other liquid soap.  You can apply chg directly to the skin and wash.  Gently with a scrungie or clean washcloth.  5.  Apply the CHG Soap to your body ONLY FROM THE NECK DOWN.   Do   not use on face/ open                           Wound or open sores. Avoid contact with eyes, ears mouth and   genitals (private parts).                       Wash face,  Genitals (private parts) with your normal soap.             6.  Wash thoroughly, paying special attention to the area where your    surgery  will be performed.  7.  Thoroughly rinse your body with warm water from the neck down.  8.  DO NOT shower/wash with your normal soap after using and rinsing  off the CHG Soap.                9.  Pat yourself dry with a clean towel.            10.  Wear clean pajamas.            11.  Place clean sheets on your bed the night of your first shower and do not  sleep with pets. Day of Surgery : Do not apply any lotions/deodorants the morning of surgery.  Please wear clean clothes to the hospital/surgery center.  FAILURE TO FOLLOW THESE INSTRUCTIONS MAY RESULT IN THE CANCELLATION OF YOUR SURGERY  PATIENT SIGNATURE_________________________________  NURSE SIGNATURE__________________________________  ________________________________________________________________________  WHAT IS A BLOOD TRANSFUSION? Blood Transfusion Information  A transfusion is the replacement of blood or some of its parts. Blood is made up of multiple cells which provide different functions. Red blood cells carry oxygen and are used for blood loss replacement. White blood cells fight against infection. Platelets control bleeding. Plasma helps clot blood. Other blood products are available for specialized needs, such  as hemophilia or other clotting disorders. BEFORE THE TRANSFUSION  Who gives blood for transfusions?  Healthy volunteers who are fully evaluated to make sure their blood is safe. This is blood bank blood. Transfusion therapy is the safest it has ever been in the practice of medicine. Before blood is taken from a donor, a complete history is taken to make sure that person has no history of diseases nor engages in risky social behavior (examples are intravenous drug use or sexual activity with multiple partners). The donor's travel history is screened to minimize risk of transmitting infections, such as malaria. The donated blood is tested for signs of infectious diseases, such as HIV and hepatitis. The blood is then tested to be sure it is compatible with you in order to minimize the chance of a transfusion reaction. If you or a relative donates blood, this is often done in anticipation of surgery and is not appropriate for emergency situations. It takes many days to process the donated blood. RISKS AND COMPLICATIONS Although transfusion therapy is very safe and saves many lives, the main dangers of transfusion include:  Getting an infectious disease. Developing a transfusion reaction. This is an allergic reaction to something in the blood you were given. Every precaution is taken to prevent this. The decision to have a blood transfusion has been considered carefully by your caregiver before blood is given. Blood is not given unless the benefits outweigh the risks. AFTER THE TRANSFUSION Right after receiving a blood transfusion, you will usually feel much better and more energetic. This is especially true if your red blood cells have gotten low (anemic). The transfusion raises the level of the red blood cells which carry oxygen, and this usually causes an energy increase. The nurse administering the transfusion will monitor you carefully for complications. HOME CARE INSTRUCTIONS  No special instructions  are needed after a transfusion. You may find your energy is better. Speak with your caregiver about any limitations on activity for underlying diseases you may have. SEEK MEDICAL CARE IF:  Your condition is not improving after your transfusion. You develop redness or irritation at the intravenous (IV) site. SEEK IMMEDIATE MEDICAL CARE IF:  Any of the following symptoms occur over the next 12 hours: Shaking chills. You have a temperature by mouth above 102 F (38.9 C), not controlled by medicine. Chest, back, or muscle pain. People around you feel you are  not acting correctly or are confused. Shortness of breath or difficulty breathing. Dizziness and fainting. You get a rash or develop hives. You have a decrease in urine output. Your urine turns a dark color or changes to pink, red, or brown. Any of the following symptoms occur over the next 10 days: You have a temperature by mouth above 102 F (38.9 C), not controlled by medicine. Shortness of breath. Weakness after normal activity. The white part of the eye turns yellow (jaundice). You have a decrease in the amount of urine or are urinating less often. Your urine turns a dark color or changes to pink, red, or brown. Document Released: 10/27/2000 Document Revised: 01/22/2012 Document Reviewed: 06/15/2008 [facility] Patient Information 2014 [place], [place].  _______________________________________________________________________

## 2022-03-31 ENCOUNTER — Encounter (HOSPITAL_COMMUNITY): Payer: Self-pay

## 2022-03-31 ENCOUNTER — Encounter (HOSPITAL_COMMUNITY)
Admission: RE | Admit: 2022-03-31 | Discharge: 2022-03-31 | Disposition: A | Discharge status: Home / Self Care | Payer: Medicare Other | Source: Ambulatory Visit | Attending: Gynecologic Oncology | Admitting: Gynecologic Oncology

## 2022-03-31 DIAGNOSIS — N9489 Other specified conditions associated with female genital organs and menstrual cycle: Secondary | ICD-10-CM | POA: Diagnosis not present

## 2022-03-31 DIAGNOSIS — Z01812 Encounter for preprocedural laboratory examination: Secondary | ICD-10-CM | POA: Diagnosis not present

## 2022-03-31 LAB — CBC
HCT: 39.4 % (ref 36.0–46.0)
Hemoglobin: 12.8 g/dL (ref 12.0–15.0)
MCH: 31.1 pg (ref 26.0–34.0)
MCHC: 32.5 g/dL (ref 30.0–36.0)
MCV: 95.9 fL (ref 80.0–100.0)
Platelets: 252 10*3/uL (ref 150–400)
RBC: 4.11 MIL/uL (ref 3.87–5.11)
RDW: 13 % (ref 11.5–15.5)
WBC: 5.9 10*3/uL (ref 4.0–10.5)
nRBC: 0 % (ref 0.0–0.2)

## 2022-03-31 LAB — COMPREHENSIVE METABOLIC PANEL
ALT: 17 U/L (ref 0–44)
AST: 21 U/L (ref 15–41)
Albumin: 4.1 g/dL (ref 3.5–5.0)
Alkaline Phosphatase: 37 U/L — ABNORMAL LOW (ref 38–126)
Anion gap: 5 (ref 5–15)
BUN: 24 mg/dL — ABNORMAL HIGH (ref 8–23)
CO2: 27 mmol/L (ref 22–32)
Calcium: 9.4 mg/dL (ref 8.9–10.3)
Chloride: 108 mmol/L (ref 98–111)
Creatinine, Ser: 1.1 mg/dL — ABNORMAL HIGH (ref 0.44–1.00)
GFR, Estimated: 51 mL/min — ABNORMAL LOW (ref >60)
Glucose, Bld: 96 mg/dL (ref 70–99)
Potassium: 4.5 mmol/L (ref 3.5–5.1)
Sodium: 140 mmol/L (ref 135–145)
Total Bilirubin: 0.8 mg/dL (ref 0.3–1.2)
Total Protein: 7.5 g/dL (ref 6.5–8.1)

## 2022-03-31 LAB — TYPE AND SCREEN
ABO/RH(D): A POS
Antibody Screen: NEGATIVE

## 2022-04-03 ENCOUNTER — Telehealth: Payer: Self-pay | Admitting: *Deleted

## 2022-04-03 NOTE — Telephone Encounter (Signed)
Spoke with pt today who stated that she can switch her surgery date to 04/05/22. She would just like to know the time ASAP so she can tell her daughter who will be brining her to surgery. Melissa cross, NP notified.

## 2022-04-03 NOTE — Telephone Encounter (Signed)
Attempted to call and review BUN and Creatinine results with pt. Unable to reach her. LVM for return call.

## 2022-04-03 NOTE — Telephone Encounter (Signed)
Spoke with pt today to review results of her Bun and Creatinine. Her BUN is 24 and creatinine is 1.10. These are slightly elevated and per Joylene John, NP pt is to stay hydrated and avoid NSAIDS. She verbalized understanding.

## 2022-04-03 NOTE — Telephone Encounter (Signed)
Spoke with pt today to let her know that her surgery will be at 0730 and she is to arrive at 0530. Informed her that someone from the surgery center will call her tomorrow and review more information with her. She verbalized understanding.

## 2022-04-04 ENCOUNTER — Telehealth: Payer: Self-pay

## 2022-04-04 NOTE — Telephone Encounter (Signed)
Telephone call to check on pre-operative status.  Patient compliant with pre-operative instructions.  Reinforced nothing to eat after midnight. Clear liquids until 0430. Patient to arrive at 0530.  No questions or concerns voiced.  Instructed to call for any needs.

## 2022-04-04 NOTE — Anesthesia Preprocedure Evaluation (Signed)
Anesthesia Evaluation  Patient identified by MRN, date of birth, ID band Patient awake    Reviewed: Allergy & Precautions, NPO status , Patient's Chart, lab work & pertinent test results  History of Anesthesia Complications Negative for: history of anesthetic complications  Airway Mallampati: III  TM Distance: <3 FB Neck ROM: Full    Dental  (+) Dental Advisory Given, Teeth Intact   Pulmonary asthma ,    Pulmonary exam normal        Cardiovascular Normal cardiovascular exam   '23 TTE - EF 60 to 65%. Grade I diastolic dysfunction (impaired relaxation). Trivial mitral valve regurgitation. Aortic valve regurgitation is mild.     Neuro/Psych PSYCHIATRIC DISORDERS Anxiety negative neurological ROS     GI/Hepatic Neg liver ROS, GERD  Medicated and Controlled,  Endo/Other  negative endocrine ROS  Renal/GU negative Renal ROS     Musculoskeletal negative musculoskeletal ROS (+)   Abdominal   Peds  Hematology negative hematology ROS (+)   Anesthesia Other Findings   Reproductive/Obstetrics                            Anesthesia Physical Anesthesia Plan  ASA: 2  Anesthesia Plan: General   Post-op Pain Management: Tylenol PO (pre-op)*   Induction: Intravenous  PONV Risk Score and Plan: 3 and Treatment may vary due to age or medical condition, Ondansetron and Propofol infusion  Airway Management Planned: Oral ETT and Video Laryngoscope Planned  Additional Equipment: None  Intra-op Plan:   Post-operative Plan: Extubation in OR  Informed Consent: I have reviewed the patients History and Physical, chart, labs and discussed the procedure including the risks, benefits and alternatives for the proposed anesthesia with the patient or authorized representative who has indicated his/her understanding and acceptance.     Dental advisory given  Plan Discussed with: CRNA and  Anesthesiologist  Anesthesia Plan Comments:        Anesthesia Quick Evaluation

## 2022-04-05 ENCOUNTER — Other Ambulatory Visit: Payer: Self-pay

## 2022-04-05 ENCOUNTER — Ambulatory Visit (HOSPITAL_COMMUNITY): Payer: Medicare Other

## 2022-04-05 ENCOUNTER — Ambulatory Visit (HOSPITAL_COMMUNITY)
Admission: RE | Admit: 2022-04-05 | Discharge: 2022-04-05 | Disposition: A | Discharge status: Home / Self Care | Payer: Medicare Other | Source: Ambulatory Visit | Attending: Gynecologic Oncology | Admitting: Gynecologic Oncology

## 2022-04-05 ENCOUNTER — Ambulatory Visit (HOSPITAL_BASED_OUTPATIENT_CLINIC_OR_DEPARTMENT_OTHER): Payer: Medicare Other | Admitting: Anesthesiology

## 2022-04-05 ENCOUNTER — Ambulatory Visit (HOSPITAL_COMMUNITY): Payer: Medicare Other | Admitting: Anesthesiology

## 2022-04-05 ENCOUNTER — Encounter (HOSPITAL_COMMUNITY): Payer: Self-pay | Admitting: Gynecologic Oncology

## 2022-04-05 ENCOUNTER — Encounter (HOSPITAL_COMMUNITY)
Admission: RE | Disposition: A | Discharge status: Home / Self Care | Payer: Self-pay | Source: Ambulatory Visit | Attending: Gynecologic Oncology

## 2022-04-05 DIAGNOSIS — N9489 Other specified conditions associated with female genital organs and menstrual cycle: Secondary | ICD-10-CM

## 2022-04-05 DIAGNOSIS — D270 Benign neoplasm of right ovary: Secondary | ICD-10-CM | POA: Diagnosis not present

## 2022-04-05 DIAGNOSIS — Z800 Family history of malignant neoplasm of digestive organs: Secondary | ICD-10-CM | POA: Insufficient documentation

## 2022-04-05 DIAGNOSIS — R1909 Other intra-abdominal and pelvic swelling, mass and lump: Secondary | ICD-10-CM | POA: Diagnosis not present

## 2022-04-05 DIAGNOSIS — N898 Other specified noninflammatory disorders of vagina: Secondary | ICD-10-CM

## 2022-04-05 DIAGNOSIS — K219 Gastro-esophageal reflux disease without esophagitis: Secondary | ICD-10-CM | POA: Diagnosis not present

## 2022-04-05 DIAGNOSIS — R1900 Intra-abdominal and pelvic swelling, mass and lump, unspecified site: Secondary | ICD-10-CM | POA: Diagnosis not present

## 2022-04-05 DIAGNOSIS — J45909 Unspecified asthma, uncomplicated: Secondary | ICD-10-CM | POA: Diagnosis not present

## 2022-04-05 DIAGNOSIS — N838 Other noninflammatory disorders of ovary, fallopian tube and broad ligament: Secondary | ICD-10-CM

## 2022-04-05 DIAGNOSIS — Z01818 Encounter for other preprocedural examination: Secondary | ICD-10-CM | POA: Diagnosis not present

## 2022-04-05 HISTORY — PX: ROBOTIC ASSISTED BILATERAL SALPINGO OOPHERECTOMY: SHX6078

## 2022-04-05 LAB — ABO/RH: ABO/RH(D): A POS

## 2022-04-05 SURGERY — SALPINGO-OOPHORECTOMY, BILATERAL, ROBOT-ASSISTED
Anesthesia: General | Site: Abdomen | Laterality: Bilateral

## 2022-04-05 MED ORDER — STERILE WATER FOR IRRIGATION IR SOLN
Status: DC | PRN
  Administered 2022-04-05: 1000 mL

## 2022-04-05 MED ORDER — ORAL CARE MOUTH RINSE: 15.0000 mL | Freq: Once | OROMUCOSAL | Status: AC

## 2022-04-05 MED ORDER — FENTANYL CITRATE (PF) 250 MCG/5ML IJ SOLN
INTRAMUSCULAR | Status: AC
  Filled 2022-04-05: qty 5

## 2022-04-05 MED ORDER — OXYCODONE HCL 5 MG PO TABS
5.0000 mg | ORAL_TABLET | Freq: Once | ORAL | Status: AC | PRN
  Administered 2022-04-05: 5 mg via ORAL

## 2022-04-05 MED ORDER — FENTANYL CITRATE PF 50 MCG/ML IJ SOSY
25.0000 ug | PREFILLED_SYRINGE | INTRAMUSCULAR | Status: DC | PRN
  Administered 2022-04-05: 50 ug via INTRAVENOUS

## 2022-04-05 MED ORDER — CHLORHEXIDINE GLUCONATE 0.12 % MT SOLN
15.0000 mL | Freq: Once | OROMUCOSAL | Status: AC
  Administered 2022-04-05: 15 mL via OROMUCOSAL

## 2022-04-05 MED ORDER — SUGAMMADEX SODIUM 200 MG/2ML IV SOLN
INTRAVENOUS | Status: DC | PRN
  Administered 2022-04-05: 200 mg via INTRAVENOUS

## 2022-04-05 MED ORDER — FENTANYL CITRATE PF 50 MCG/ML IJ SOSY
PREFILLED_SYRINGE | INTRAMUSCULAR | Status: AC
  Administered 2022-04-05: 50 ug via INTRAVENOUS
  Filled 2022-04-05: qty 2

## 2022-04-05 MED ORDER — ROCURONIUM BROMIDE 10 MG/ML (PF) SYRINGE
PREFILLED_SYRINGE | INTRAVENOUS | Status: AC
  Filled 2022-04-05: qty 10

## 2022-04-05 MED ORDER — ACETAMINOPHEN 500 MG PO TABS
1000.0000 mg | ORAL_TABLET | ORAL | Status: AC
  Administered 2022-04-05: 1000 mg via ORAL
  Filled 2022-04-05: qty 2

## 2022-04-05 MED ORDER — BUPIVACAINE HCL (PF) 0.25 % IJ SOLN
INTRAMUSCULAR | Status: AC
  Filled 2022-04-05: qty 30

## 2022-04-05 MED ORDER — ONDANSETRON HCL 4 MG/2ML IJ SOLN: 4.0000 mg | Freq: Once | INTRAMUSCULAR | Status: DC | PRN

## 2022-04-05 MED ORDER — DEXAMETHASONE SODIUM PHOSPHATE 4 MG/ML IJ SOLN: 4.0000 mg | INTRAMUSCULAR | Status: DC

## 2022-04-05 MED ORDER — ACETAMINOPHEN 500 MG PO TABS: 1000.0000 mg | ORAL_TABLET | Freq: Once | ORAL | Status: DC

## 2022-04-05 MED ORDER — LACTATED RINGERS IV SOLN
INTRAVENOUS | Status: DC
Start: 2022-04-05 — End: 2022-04-05

## 2022-04-05 MED ORDER — PROPOFOL 10 MG/ML IV BOLUS
INTRAVENOUS | Status: AC
  Filled 2022-04-05: qty 20

## 2022-04-05 MED ORDER — FENTANYL CITRATE PF 50 MCG/ML IJ SOSY
PREFILLED_SYRINGE | INTRAMUSCULAR | Status: AC
  Filled 2022-04-05: qty 1

## 2022-04-05 MED ORDER — DEXAMETHASONE SODIUM PHOSPHATE 10 MG/ML IJ SOLN
INTRAMUSCULAR | Status: DC | PRN
  Administered 2022-04-05: 5 mg via INTRAVENOUS

## 2022-04-05 MED ORDER — OXYCODONE HCL 5 MG/5ML PO SOLN: 5.0000 mg | Freq: Once | ORAL | Status: AC | PRN

## 2022-04-05 MED ORDER — OXYCODONE HCL 5 MG PO TABS
ORAL_TABLET | ORAL | Status: AC
  Filled 2022-04-05: qty 1

## 2022-04-05 MED ORDER — FENTANYL CITRATE (PF) 250 MCG/5ML IJ SOLN
INTRAMUSCULAR | Status: DC | PRN
  Administered 2022-04-05: 50 ug via INTRAVENOUS

## 2022-04-05 MED ORDER — PROPOFOL 10 MG/ML IV BOLUS
INTRAVENOUS | Status: DC | PRN
  Administered 2022-04-05: 25 ug/kg/min via INTRAVENOUS
  Administered 2022-04-05: 120 mg via INTRAVENOUS

## 2022-04-05 MED ORDER — SILVER NITRATE-POT NITRATE 75-25 % EX MISC
CUTANEOUS | Status: AC
  Filled 2022-04-05: qty 10

## 2022-04-05 MED ORDER — LIDOCAINE HCL (PF) 2 % IJ SOLN
INTRAMUSCULAR | Status: AC
  Filled 2022-04-05: qty 5

## 2022-04-05 MED ORDER — BUPIVACAINE HCL 0.25 % IJ SOLN
INTRAMUSCULAR | Status: DC | PRN
  Administered 2022-04-05: 30 mL

## 2022-04-05 MED ORDER — LACTATED RINGERS IR SOLN
Status: DC | PRN
  Administered 2022-04-05: 1000 mL

## 2022-04-05 MED ORDER — ENSURE PRE-SURGERY PO LIQD
296.0000 mL | Freq: Once | ORAL | Status: DC
  Filled 2022-04-05: qty 296

## 2022-04-05 MED ORDER — ROCURONIUM BROMIDE 10 MG/ML (PF) SYRINGE
PREFILLED_SYRINGE | INTRAVENOUS | Status: DC | PRN
  Administered 2022-04-05 (×2): 50 mg via INTRAVENOUS

## 2022-04-05 MED ORDER — HEPARIN SODIUM (PORCINE) 5000 UNIT/ML IJ SOLN
5000.0000 [IU] | INTRAMUSCULAR | Status: AC
  Administered 2022-04-05: 5000 [IU] via SUBCUTANEOUS
  Filled 2022-04-05: qty 1

## 2022-04-05 MED ORDER — LIDOCAINE HCL (CARDIAC) PF 100 MG/5ML IV SOSY
PREFILLED_SYRINGE | INTRAVENOUS | Status: DC | PRN
  Administered 2022-04-05: 40 mg via INTRAVENOUS

## 2022-04-05 MED ORDER — PHENYLEPHRINE 80 MCG/ML (10ML) SYRINGE FOR IV PUSH (FOR BLOOD PRESSURE SUPPORT)
PREFILLED_SYRINGE | INTRAVENOUS | Status: DC | PRN
  Administered 2022-04-05: 120 ug via INTRAVENOUS
  Administered 2022-04-05 (×3): 80 ug via INTRAVENOUS

## 2022-04-05 SURGICAL SUPPLY — 75 items
ADH SKN CLS APL DERMABOND .7 (GAUZE/BANDAGES/DRESSINGS) ×3
AGENT HMST KT MTR STRL THRMB (HEMOSTASIS)
APL ESCP 34 STRL LF DISP (HEMOSTASIS)
APPLICATOR SURGIFLO ENDO (HEMOSTASIS) IMPLANT
BACTOSHIELD CHG 4% 4OZ (MISCELLANEOUS) ×1
BAG LAPAROSCOPIC 12 15 PORT 16 (BASKET) IMPLANT
BAG RETRIEVAL 10 (BASKET) ×2
BAG RETRIEVAL 12/15 (BASKET)
BLADE SURG SZ10 CARB STEEL (BLADE) IMPLANT
COVER BACK TABLE 60X90IN (DRAPES) ×5 IMPLANT
COVER TIP SHEARS 8 DVNC (MISCELLANEOUS) ×4 IMPLANT
COVER TIP SHEARS 8MM DA VINCI (MISCELLANEOUS) ×4
DERMABOND ADVANCED (GAUZE/BANDAGES/DRESSINGS) ×1
DERMABOND ADVANCED .7 DNX12 (GAUZE/BANDAGES/DRESSINGS) ×4 IMPLANT
DRAPE ARM DVNC X/XI (DISPOSABLE) ×16 IMPLANT
DRAPE COLUMN DVNC XI (DISPOSABLE) ×4 IMPLANT
DRAPE DA VINCI XI ARM (DISPOSABLE) ×16
DRAPE DA VINCI XI COLUMN (DISPOSABLE) ×4
DRAPE SHEET LG 3/4 BI-LAMINATE (DRAPES) ×5 IMPLANT
DRAPE SURG IRRIG POUCH 19X23 (DRAPES) ×5 IMPLANT
DRSG OPSITE POSTOP 4X6 (GAUZE/BANDAGES/DRESSINGS) IMPLANT
DRSG OPSITE POSTOP 4X8 (GAUZE/BANDAGES/DRESSINGS) IMPLANT
ELECT PENCIL ROCKER SW 15FT (MISCELLANEOUS) IMPLANT
ELECT REM PT RETURN 15FT ADLT (MISCELLANEOUS) ×5 IMPLANT
GAUZE 4X4 16PLY ~~LOC~~+RFID DBL (SPONGE) ×5 IMPLANT
GLOVE BIO SURGEON STRL SZ 6 (GLOVE) ×20 IMPLANT
GLOVE BIO SURGEON STRL SZ 6.5 (GLOVE) ×10 IMPLANT
GOWN STRL REUS W/ TWL LRG LVL3 (GOWN DISPOSABLE) ×16 IMPLANT
GOWN STRL REUS W/TWL LRG LVL3 (GOWN DISPOSABLE) ×16
HOLDER FOLEY CATH W/STRAP (MISCELLANEOUS) IMPLANT
IRRIG SUCT STRYKERFLOW 2 WTIP (MISCELLANEOUS) ×4
IRRIGATION SUCT STRKRFLW 2 WTP (MISCELLANEOUS) ×4 IMPLANT
KIT PROCEDURE DA VINCI SI (MISCELLANEOUS)
KIT PROCEDURE DVNC SI (MISCELLANEOUS) IMPLANT
KIT TURNOVER KIT A (KITS) IMPLANT
LIGASURE IMPACT 36 18CM CVD LR (INSTRUMENTS) IMPLANT
MANIPULATOR ADVINCU DEL 3.0 PL (MISCELLANEOUS) IMPLANT
MANIPULATOR ADVINCU DEL 3.5 PL (MISCELLANEOUS) IMPLANT
MANIPULATOR UTERINE 4.5 ZUMI (MISCELLANEOUS) IMPLANT
NDL HYPO 21X1.5 SAFETY (NEEDLE) ×2 IMPLANT
NDL SPNL 18GX3.5 QUINCKE PK (NEEDLE) IMPLANT
NEEDLE HYPO 21X1.5 SAFETY (NEEDLE) ×4 IMPLANT
NEEDLE SPNL 18GX3.5 QUINCKE PK (NEEDLE) IMPLANT
OBTURATOR OPTICAL STANDARD 8MM (TROCAR) ×4
OBTURATOR OPTICAL STND 8 DVNC (TROCAR) ×3
OBTURATOR OPTICALSTD 8 DVNC (TROCAR) ×4 IMPLANT
PACK ROBOT GYN CUSTOM WL (TRAY / TRAY PROCEDURE) ×5 IMPLANT
PAD POSITIONING PINK XL (MISCELLANEOUS) ×5 IMPLANT
PORT ACCESS TROCAR AIRSEAL 12 (TROCAR) ×4 IMPLANT
PORT ACCESS TROCAR AIRSEAL 5M (TROCAR) ×1
SCRUB CHG 4% DYNA-HEX 4OZ (MISCELLANEOUS) ×4 IMPLANT
SEAL CANN UNIV 5-8 DVNC XI (MISCELLANEOUS) ×16 IMPLANT
SEAL XI 5MM-8MM UNIVERSAL (MISCELLANEOUS) ×16
SET TRI-LUMEN FLTR TB AIRSEAL (TUBING) ×5 IMPLANT
SPIKE FLUID TRANSFER (MISCELLANEOUS) ×5 IMPLANT
SURGIFLO W/THROMBIN 8M KIT (HEMOSTASIS) IMPLANT
SUT MNCRL AB 4-0 PS2 18 (SUTURE) ×6 IMPLANT
SUT PDS AB 1 TP1 96 (SUTURE) IMPLANT
SUT VIC AB 0 CT1 27 (SUTURE)
SUT VIC AB 0 CT1 27XBRD ANTBC (SUTURE) IMPLANT
SUT VIC AB 2-0 CT1 27 (SUTURE)
SUT VIC AB 2-0 CT1 TAPERPNT 27 (SUTURE) IMPLANT
SUT VIC AB 4-0 PS2 18 (SUTURE) ×10 IMPLANT
SYR 10ML LL (SYRINGE) IMPLANT
SYS BAG RETRIEVAL 10MM (BASKET) ×6
SYS WOUND ALEXIS 18CM MED (MISCELLANEOUS)
SYSTEM BAG RETRIEVAL 10MM (BASKET) ×4 IMPLANT
SYSTEM WOUND ALEXIS 18CM MED (MISCELLANEOUS) IMPLANT
TOWEL OR NON WOVEN STRL DISP B (DISPOSABLE) IMPLANT
TRAP SPECIMEN MUCUS 40CC (MISCELLANEOUS) IMPLANT
TRAY FOLEY MTR SLVR 16FR STAT (SET/KITS/TRAYS/PACK) ×5 IMPLANT
TROCAR Z-THREAD FIOS 5X100MM (TROCAR) IMPLANT
UNDERPAD 30X36 HEAVY ABSORB (UNDERPADS AND DIAPERS) ×10 IMPLANT
WATER STERILE IRR 1000ML POUR (IV SOLUTION) ×5 IMPLANT
YANKAUER SUCT BULB TIP 10FT TU (MISCELLANEOUS) IMPLANT

## 2022-04-05 NOTE — Interval H&P Note (Signed)
History and Physical Interval Note:  04/05/2022 6:54 AM  Mckenzie Collins  has presented today for surgery, with the diagnosis of complex pelvic mass.  The various methods of treatment have been discussed with the patient and family. After consideration of risks, benefits and other options for treatment, the patient has consented to  Procedure(s): XI ROBOTIC [place] (Bilateral) POSSIBLE XI ROBOTIC ASSISTED TOTAL HYSTERECTOMY, POSSIBLE STAGING, POSSIBLE LAPAROTOMY (N/A) as a surgical intervention.  The patient's history has been reviewed, patient examined, no change in status, stable for surgery.  I have reviewed the patient's chart and labs.  Questions were answered to the patient's satisfaction.     [name]

## 2022-04-05 NOTE — Anesthesia Procedure Notes (Signed)
Procedure Name: Intubation Date/Time: 04/05/2022 7:41 AM Performed by: Audry Pili, MD Pre-anesthesia Checklist: Patient identified, Emergency Drugs available, Suction available and Patient being monitored Patient Re-evaluated:Patient Re-evaluated prior to induction Oxygen Delivery Method: Circle system utilized Preoxygenation: Pre-oxygenation with 100% oxygen Induction Type: IV induction Ventilation: Mask ventilation without difficulty Laryngoscope Size: Glidescope and 3 Grade View: Grade I Tube type: Oral Tube size: 7.0 mm Number of attempts: 3 Airway Equipment and Method: Stylet and Video-laryngoscopy Placement Confirmation: ETT inserted through vocal cords under direct vision, positive ETCO2 and breath sounds checked- equal and bilateral Secured at: 22 cm Tube secured with: Tape Dental Injury: Teeth and Oropharynx as per pre-operative assessment and Injury to lip  Difficulty Due To: Difficult Airway- due to anterior larynx Comments: DL x1 by CRNA with MAC 3--Grade 4 view. DL x1 by CRNA with Sabra Heck 2--Grade 3 view. Glidescope LoPro 3 by MDA with Grade 1 view. Small lip cut during attempted intubation with MAC 3 blade. Able to mask ventilate easily.

## 2022-04-05 NOTE — Anesthesia Postprocedure Evaluation (Signed)
Anesthesia Post Note  Patient: Mckenzie Collins  Procedure(s) Performed: Mechele Claude robotic assisted salpingo oophorectomy, vaginal biopsy (Bilateral: Abdomen)     Patient location during evaluation: PACU Anesthesia Type: General Level of consciousness: awake and alert Pain management: pain level controlled Vital Signs Assessment: post-procedure vital signs reviewed and stable Respiratory status: spontaneous breathing, nonlabored ventilation and respiratory function stable Cardiovascular status: stable and blood pressure returned to baseline Anesthetic complications: no   No notable events documented.  Last Vitals:  Vitals:   04/05/22 0930 04/05/22 0945  BP: (!) 127/92 120/60  Pulse: 79 72  Resp: 17 14  Temp:    SpO2: 99% 100%    Last Pain:  Vitals:   04/05/22 0945  TempSrc:   PainSc: [place]

## 2022-04-05 NOTE — Op Note (Signed)
OPERATIVE NOTE  Pre-operative Diagnosis: Adnexal mass  Post-operative Diagnosis: same, benign cyst on frozen section (suspected cystadenofibroma, possibly mucinous)  Operation: Robotic-assisted laparoscopic bilateral salpingo-oophorectomy, vaginal sidewall biopsy   Surgeon: Jeral Pinch MD  Assistant Surgeon: Joylene John NP  Anesthesia: GET  Urine Output: 150cc  Operative Findings:  On EUA, several small (2-66m) nodules within the vagina, smooth. Small mobile uterus, 3cm mobile mass. On intra-abdominal entry, normal upper abdominal survey. Mildly enlarged gallbladder with enlarged right liver. Normal small and large bowel, omentum. 3cm mostly cystic right ovary mass. Normal left ovary, bilateral fallopian tubes. Uterus 6cm and normal appearing. No obvious adenopathy. No intra-abdominal or pelvic findings concerning for metastatic disease. No ascites.  Estimated Blood Loss:  25cc      Total IV Fluids: see I&O flowsheet         Specimens: bilateral tubes and ovaries, pelvic washings, right vaginal sidewall biopsy         Complications:  None apparent; patient tolerated the procedure well.         Disposition: PACU - hemodynamically stable.  Procedure Details  The patient was seen in the Holding Room. The risks, benefits, complications, treatment options, and expected outcomes were discussed with the patient.  The patient concurred with the proposed plan, giving informed consent.  The site of surgery properly noted/marked. The patient was identified as Mckenzie Collins the procedure verified as a Robotic-assisted bilateral salpingo-oophorectomy with any other indicated procedures.   After induction of anesthesia, the patient was draped and prepped in the usual sterile manner. Patient was placed in supine position after anesthesia and draped and prepped in the usual sterile manner as follows: Her arms were tucked to her side with all appropriate precautions.  The shoulders were  stabilized with padded shoulder blocks applied to the acromium processes.  The patient was placed in the semi-lithotomy position in [place]  The perineum and vagina were prepped with CholoraPrep. The patient was draped after the CholoraPrep had been allowed to dry for 3 minutes.  A Time Out was held and the above information confirmed.  The urethra was prepped with Betadine. Foley catheter was placed. A sterile speculum was placed in the vagina.  The cervix was grasped with a single-tooth tenaculum. A Hulka manipulator was placed.  OG tube placement was confirmed and to suction.   Next, a 10 mm skin incision was made 1 cm below the subcostal margin in the midclavicular line.  The 5 mm Optiview port and scope was used for direct entry.  Opening pressure was under 10 mm CO2.  The abdomen was insufflated and the findings were noted as above.   At this point and all points during the procedure, the patient's intra-abdominal pressure did not exceed 15 mmHg. Next, an 8 mm skin incision was made superior to the umbilicus and a right and left port were placed about 8 cm lateral to the robot port on the right and left side.  The 5 mm assist trocar was exchanged for a 10-12 mm port. All ports were placed under direct visualization.  The patient was placed in steep Trendelenburg.  Bowel was folded away into the upper abdomen.  The robot was docked in the normal manner.  The right and left peritoneum were opened parallel to the IP ligament to open the retroperitoneal spaces bilaterally. The round ligaments were preserved. The ureter was noted to be on the medial leaf of the broad ligament.  The peritoneum above the ureter was incised  and stretched and the infundibulopelvic ligament was skeletonized, cauterized and cut.  The utero-ovarian ligament and fallopian tube were skeletonized, cauterized and transected just lateral to the uterine fundus, freeing the adnexa. Both adnexa were placed in Endocatch bags and  removed through the assist trocar under visualization in a contained manner. Both adnexa were sent for frozen section.   Once results were back, irrigation was used and excellent hemostasis was achieved.  At this point in the procedure was completed.  Robotic instruments were removed under direct visulaization.  The robot was undocked. The deep subcutaneous tissue at the 10-12 mm port was closed with 0 Vicryl on a UR-5 needle.  The subcuticular tissue was closed with 4-0 Vicryl and the skin was closed with 4-0 Monocryl in a subcuticular manner.  Dermabond was applied.    The vagina was swabbed with minimal bleeding noted after the [place]ul[place] was removed. One of the vaginal nodules was identified on bimanual exam and biopsied with Tischler forceps. Monopolar electrocautery was used to achieve hemostasis of the biopsy site. Foley catheter was removed.  All sponge, lap and needle counts were correct x  3.   The patient was transferred to the recovery room in stable condition.  Jeral Pinch, MD

## 2022-04-05 NOTE — Transfer of Care (Signed)
Immediate Anesthesia Transfer of Care Note  Patient: Mckenzie Collins  Procedure(s) Performed: Mechele Claude robotic assisted salpingo oophorectomy, vaginal biopsy (Bilateral: Abdomen)  Patient Location: PACU  Anesthesia Type:General  Level of Consciousness: awake, alert , oriented and patient cooperative  Airway & Oxygen Therapy: Patient Spontanous Breathing and Patient connected to face mask oxygen  Post-op Assessment: Report given to RN and Post -op Vital signs reviewed and stable  Post vital signs: Reviewed and stable  Last Vitals:  Vitals Value Taken Time  BP 121/100 04/05/22 0924  Temp    Pulse 81 04/05/22 0925  Resp 17 04/05/22 0925  SpO2 100 % 04/05/22 0925  Vitals shown include unvalidated device data.  Last Pain:  Vitals:   04/05/22 0603  TempSrc: [place]  PainSc:          Complications: No notable events documented.

## 2022-04-05 NOTE — Discharge Instructions (Addendum)
AFTER SURGERY INSTRUCTIONS   Return to work: 4-6 weeks if applicable   Activity: 1. Be up and out of the bed during the day.  Take a nap if needed.  You may walk up steps but be careful and use the hand rail.  Stair climbing will tire you more than you think, you may need to stop part way and rest.    2. No lifting or straining for 6 weeks over 10 pounds. No pushing, pulling, straining for 6 weeks.   3. No driving for around 1 week(s).  Do not drive if you are taking narcotic pain medicine and make sure that your reaction time has returned.    4. You can shower as soon as the next day after surgery. Shower daily.  Use your regular soap and water (not directly on the incision) and pat your incision(s) dry afterwards; don't rub.  No tub baths or submerging your body in water until cleared by your surgeon. If you have the soap that was given to you by pre-surgical testing that was used before surgery, you do not need to use it afterwards because this can irritate your incisions.    5. No sexual activity and nothing in the vagina for 4 weeks.   6. You may experience a small amount of clear drainage from your incisions, which is normal.  If the drainage persists, increases, or changes color please call the office.   7. Do not use creams, lotions, or ointments such as neosporin on your incisions after surgery until advised by your surgeon because they can cause removal of the dermabond glue on your incisions.     8. You may experience vaginal spotting after surgery. The spotting is normal but if you experience heavy bleeding, call our office.   9. Take Tylenol or ibuprofen first for pain IF YOU ARE ABLE TO TAKE THESE MEDICATIONS and only use narcotic pain medication for severe pain not relieved by the Tylenol or Ibuprofen.  Monitor your Tylenol intake to a max of 4,000 mg in a 24 hour period. You can alternate these medications after surgery.   Diet: 1. Low sodium Heart Healthy Diet is recommended  but you are cleared to resume your normal (before surgery) diet after your procedure.   2. It is safe to use a laxative, such as Miralax or Colace, if you have difficulty moving your bowels. You have been prescribed Sennakot-S to take at bedtime every evening after surgery to keep bowel movements regular and to prevent constipation.     Wound Care: 1. Keep clean and dry.  Shower daily.   Reasons to call the Doctor: Fever - Oral temperature greater than 100.4 degrees Fahrenheit Foul-smelling vaginal discharge Difficulty urinating Nausea and vomiting Increased pain at the site of the incision that is unrelieved with pain medicine. Difficulty breathing with or without chest pain New calf pain especially if only on one side Sudden, continuing increased vaginal bleeding with or without clots.   Contacts: For questions or concerns you should contact:   Dr. Jeral Pinch at [phone]   Joylene John, NP at [phone]   After Hours: call [phone] and have the GYN Oncologist paged/contacted (after 5 pm or on the weekends).   Messages sent via mychart are for non-urgent matters and are not responded to after hours so for urgent needs, please call the after hours number.

## 2022-04-06 ENCOUNTER — Telehealth: Payer: Self-pay

## 2022-04-06 ENCOUNTER — Encounter (HOSPITAL_COMMUNITY): Payer: Self-pay | Admitting: Gynecologic Oncology

## 2022-04-06 ENCOUNTER — Telehealth: Payer: Self-pay | Admitting: Gynecologic Oncology

## 2022-04-06 LAB — CYTOLOGY - NON PAP

## 2022-04-06 LAB — SURGICAL PATHOLOGY

## 2022-04-06 NOTE — Telephone Encounter (Signed)
Spoke with Mckenzie Collins this morning. She states she is eating and drinking well. She reports intense burning with urination last night. Patient has been applying a warm wash cloth to vulva after urination and reports this has helped. Burning is improving. Advised to drink plenty of water throughout the day, monitor symptoms and notify our office if symptoms persist or worsen. She has not had a BM yet but is passing gas. She is taking senokot as prescribed. She denies fever or chills. She reports a very small amount of yellow tinged drainage from her incision. Some drainage is expected, advised to monitor and notify office for increasing drainage, change in drainage color, redness, swelling, or warmth.  She rates her pain 1/10. Her pain is controlled with alternating tylenol and motrin.    Instructed to call office with any fever, chills, purulent drainage, uncontrolled pain or any other questions or concerns. Patient verbalizes understanding of all of the above information.  Pt aware of post op appointments as well as the office number [phone] and after hours number [phone] to call if she has any questions or concerns

## 2022-04-06 NOTE — Telephone Encounter (Signed)
Discussed pathology with patient - she is very happy with this news. Doing well after surgery. Plan to cancel phone visit 6/2.   Jeral Pinch MD Gynecologic Oncology

## 2022-04-12 DIAGNOSIS — N9489 Other specified conditions associated with female genital organs and menstrual cycle: Secondary | ICD-10-CM

## 2022-04-14 ENCOUNTER — Telehealth: Payer: Medicare Other | Admitting: Gynecologic Oncology

## 2022-04-19 ENCOUNTER — Inpatient Hospital Stay: Payer: Medicare Other | Admitting: Gynecologic Oncology

## 2022-04-27 ENCOUNTER — Encounter: Payer: Self-pay | Admitting: Gynecologic Oncology

## 2022-05-01 ENCOUNTER — Inpatient Hospital Stay: Payer: Medicare Other | Attending: Gynecologic Oncology | Admitting: Gynecologic Oncology

## 2022-05-01 ENCOUNTER — Other Ambulatory Visit: Payer: Self-pay

## 2022-05-01 VITALS — BP 115/56 | HR 87 | Temp 98.4°F | Resp 16 | Ht 63.0 in | Wt 135.0 lb

## 2022-05-01 DIAGNOSIS — N838 Other noninflammatory disorders of ovary, fallopian tube and broad ligament: Secondary | ICD-10-CM

## 2022-05-01 DIAGNOSIS — D270 Benign neoplasm of right ovary: Secondary | ICD-10-CM

## 2022-05-01 DIAGNOSIS — Z90722 Acquired absence of ovaries, bilateral: Secondary | ICD-10-CM

## 2022-05-01 NOTE — Patient Instructions (Signed)
It was good to see you today.  You are healing well from surgery.  Please remember, no heavy lifting for 6 weeks after surgery.  Do not hesitate to call if you need anything!

## 2022-05-01 NOTE — Progress Notes (Signed)
Gynecologic Oncology Return Clinic Visit  05/01/2022  Reason for Visit: Follow-up after recent surgery  Treatment History: 04/05/2022: Robotic assisted BSO, vaginal sidewall biopsy  Interval History: Patient presents today for follow-up after recent surgery.  Reports overall doing well.  Denies any significant abdominal pain, just has some soreness still around some of her incisions.  Reports regular bowel and bladder function.  Appetite has been good, denies any nausea or emesis.  Fatigue is at baseline.  Past Medical/Surgical History: Past Medical History:  Diagnosis Date   Anxiety    Asthma    uses symbicort, uses albuterol maybe once a month   Breast mass left   skin lesion around 11-12 x 2 -3 weeks   Chest pain    history of   Dyspnea 2005   Negative stress nuclear in 2005; normal echocardiogram   Gastroesophageal reflux disease    hiatal hernia; gastritis; dysphagia   Hemorrhoids    Inverted nipple left   always   Low back pain    Microscopic hematuria    Osteoporosis    Palpitations 2003   few short runs of supraventricular tachycardia on Holter   Spondylolisthesis of cervical region     Past Surgical History:  Procedure Laterality Date   CATARACT EXTRACTION Bilateral    COLONOSCOPY  2011   COLONOSCOPY WITH PROPOFOL N/A 12/08/2020   Procedure: COLONOSCOPY WITH PROPOFOL;  Surgeon: Rogene Houston, MD;  Location: [facility];  Service: Endoscopy;  Laterality: N/A;  8:30   ROBOTIC ASSISTED BILATERAL SALPINGO OOPHERECTOMY Bilateral 04/05/2022   Procedure: Xi robotic assisted salpingo oophorectomy, vaginal biopsy;  Surgeon: [name], MD;  Location: [facility];  Service: Gynecology;  Laterality: Bilateral;   TONSILLECTOMY      Family History  Problem Relation Age of Onset   Cancer Father        pancreatic   Alzheimer's disease Father    Alzheimer's disease Sister    Breast cancer Paternal Aunt        63s   Alzheimer's disease Paternal Grandfather     Congenital heart disease Brother     Social History   Socioeconomic History   Marital status: Married    Spouse name: Not on file   Number of children: 2   Years of education: Not on file   Highest education level: Not on file  Occupational History   Occupation: Retired    Comment: office work  Tobacco Use   Smoking status: Never   Smokeless tobacco: Never  Scientific laboratory technician Use: Never used  Substance and Sexual Activity   Alcohol use: Yes    Comment: rarely   Drug use: No   Sexual activity: Not Currently    Birth control/protection: Post-menopausal  Other Topics Concern   Not on file  Social History Narrative   Not on file   Social Determinants of Health   Financial Resource Strain: [place]  (04/18/2021)   Overall Financial Resource Strain (CARDIA)    Difficulty of Paying Living Expenses: Not hard at all  Food Insecurity: No Food Insecurity (04/18/2021)   Hunger Vital Sign    Worried About Running Out of Food in the Last Year: Never true    [place] in the Last Year: Never true  Transportation Needs: No Transportation Needs (04/18/2021)   PRAPARE - Hydrologist (Medical): No    Lack of Transportation (Non-Medical): No  Physical Activity: Sufficiently Active (04/18/2021)  Exercise Vital Sign    Days of Exercise per Week: 6 days    Minutes of Exercise per Session: 50 min  Stress: No Stress Concern Present (04/18/2021)   [place]    Feeling of Stress : Not at all  Social Connections: [place] (04/18/2021)   Social Connection and Isolation Panel [NHANES]    Frequency of Communication with Friends and Family: More than three times a week    Frequency of Social Gatherings with Friends and Family: Twice a week    Attends Religious Services: More than 4 times per year    Active Member of [employer] or Organizations: Yes    Attends Music therapist: More  than 4 times per year    Marital Status: Married    Current Medications:  Current Outpatient Medications:    acetaminophen (TYLENOL) 325 MG tablet, Take 650 mg by mouth every 6 (six) hours as needed for moderate pain., Disp: , Rfl:    albuterol (VENTOLIN HFA) 108 (90 Base) MCG/ACT inhaler, Inhale 2 puffs into the lungs every 4 (four) hours as needed for wheezing or shortness of breath., Disp: , Rfl:    budesonide-formoterol (SYMBICORT) 80-4.5 MCG/ACT inhaler, Inhale 2 puffs into the lungs 2 (two) times daily. (Patient taking differently: Inhale 1-2 puffs into the lungs every morning.), Disp: 1 Inhaler, Rfl: 11   Calcium Carb-Cholecalciferol (CALTRATE 600+D3 PO), Take 1 tablet by mouth in the morning and at bedtime., Disp: , Rfl:    calcium elemental as carbonate (TUMS ULTRA 1000) 400 MG chewable tablet, Chew 1,000 mg by mouth daily as needed for heartburn., Disp: , Rfl:    cholecalciferol (VITAMIN D3) 25 MCG (1000 UNIT) tablet, Take 1,000 Units by mouth daily., Disp: , Rfl:    famotidine (PEPCID) 20 MG tablet, Take 20 mg by mouth daily as needed for heartburn or indigestion., Disp: , Rfl:    MAGNESIUM GLYCINATE PO, Take 1 tablet by mouth 3 (three) times a week., Disp: , Rfl:    Multiple Vitamins-Minerals (CENTRUM SILVER ULTRA WOMENS PO), Take 1 tablet by mouth daily., Disp: , Rfl:    Propylene Glycol (SYSTANE BALANCE) 0.6 % SOLN, Place 1 drop into both eyes 2 (two) times daily as needed (dry eyes)., Disp: , Rfl:   Review of Systems: Denies appetite changes, fevers, chills, fatigue, unexplained weight changes. Denies hearing loss, neck lumps or masses, mouth sores, ringing in ears or voice changes. Denies cough or wheezing.  Denies shortness of breath. Denies chest pain or palpitations. Denies leg swelling. Denies abdominal distention, pain, blood in stools, constipation, diarrhea, nausea, vomiting, or early satiety. Denies pain with intercourse, dysuria, frequency, hematuria or  incontinence. Denies hot flashes, pelvic pain, vaginal bleeding or vaginal discharge.   Denies joint pain, back pain or muscle pain/cramps. Denies itching, rash, or wounds. Denies dizziness, headaches, numbness or seizures. Denies swollen lymph nodes or glands, denies easy bruising or bleeding. Denies anxiety, depression, confusion, or decreased concentration.  Physical Exam: BP (!) 115/56 (BP Location: Left Arm, Patient Position: Sitting)   Pulse 87   Temp 98.4 F (36.9 C) (Oral)   Resp 16   Ht '5\' 3"'$  (1.6 m)   Wt 135 lb (61.2 kg)   SpO2 100%   BMI 23.91 kg/m  General: Alert, oriented, no acute distress. HEENT: Normocephalic, atraumatic, sclera anicteric. Chest: Unlabored breathing on room air. Abdomen: soft, nontender.  Normoactive bowel sounds.  No masses or hepatosplenomegaly appreciated.  Well-healed incisions, remaining  Dermabond removed. Extremities: Grossly normal range of motion.  Warm, well perfused.  No edema bilaterally.  Laboratory & Radiologic Studies: A. OVARY AND FALLOPIAN TUBE, RIGHT, SALPINGO OOPHORECTOMY:  Benign serous cystadenofibroma  Benign Fallopian tube   B. OVARY AND FALLOPIAN TUBE, LEFT, SALPINGO OOPHORECTOMY:  Benign physiologic ovary  Benign Fallopian tube   C. VAGINA, RIGHT SIDE WALL, BIOPSY:  Benign squamous mucosa  Negative for dysplasia and diagnostic viral change   Assessment & Plan: Mckenzie Collins is a 81 y.o. woman s/p recent BSO for adnexal mass with benign final pathology.  Patient is overall doing well from a postoperative standpoint.  Discussed final pathology, which she was very happy about.  Reviewed continued postoperative restrictions as well as expectations.  Patient will be discharged back to her primary care provider.  12 minutes of total time was spent for this patient encounter, including preparation, face-to-face counseling with the patient and coordination of care, and documentation of the encounter.  Jeral Pinch, MD   Division of Gynecologic Oncology  Department of Obstetrics and Gynecology  [facility]

## 2022-05-04 ENCOUNTER — Inpatient Hospital Stay: Payer: Medicare Other

## 2022-05-04 ENCOUNTER — Other Ambulatory Visit: Payer: Self-pay | Admitting: Genetic Counselor

## 2022-05-04 ENCOUNTER — Other Ambulatory Visit: Payer: Self-pay

## 2022-05-04 ENCOUNTER — Encounter: Payer: Medicare Other | Admitting: Gynecologic Oncology

## 2022-05-04 ENCOUNTER — Inpatient Hospital Stay (HOSPITAL_BASED_OUTPATIENT_CLINIC_OR_DEPARTMENT_OTHER): Payer: Medicare Other | Admitting: Genetic Counselor

## 2022-05-04 DIAGNOSIS — Z800 Family history of malignant neoplasm of digestive organs: Secondary | ICD-10-CM

## 2022-05-04 DIAGNOSIS — Z803 Family history of malignant neoplasm of breast: Secondary | ICD-10-CM

## 2022-05-04 LAB — GENETIC SCREENING ORDER

## 2022-05-04 NOTE — Progress Notes (Incomplete)
REFERRING PROVIDER: Asencion Noble, MD [address] [place],  [place] [address]  PRIMARY PROVIDER:  Asencion Noble, MD  PRIMARY REASON FOR VISIT:  No diagnosis found.   HISTORY OF PRESENT ILLNESS:   Ms. Varone, a 81 y.o. female, was seen for a [facility] cancer genetics consultation at the request of Dr. Willey Blade due to a {Personal/family:20331} history of {cancer/polyps}.  Ms. Kisner presents to clinic today to discuss the possibility of a hereditary predisposition to cancer, to discuss genetic testing, and to further clarify her future cancer risks, as well as potential cancer risks for family members.   In ***, at the age of ***, Ms. Hardebeck was diagnosed with {CA PATHOLOGY:63853} of the {right left (wildcard):15202} {CA XHBZJ:69678}. The treatment plan ***.    *** Ms. Dershem is a 81 y.o. female with no personal history of cancer.    CANCER HISTORY:  Oncology History   No history exists.     RISK FACTORS:  Mammogram within the last year: yes Number of breast biopsies: 0. Colonoscopy: yes; {normal/abnormal/not examined:14677}. Hysterectomy: no.  Ovaries intact: no.  Menarche was at age 37.  First live birth at age 4.  OCP use for approximately 10 years.  HRT use: low dose estrogen 2-3 years; stopped more than 10 years ago   Past Medical History:  Diagnosis Date   Anxiety    Asthma    uses symbicort, uses albuterol maybe once a month   Breast mass left   skin lesion around 11-12 x 2 -3 weeks   Chest pain    history of   Dyspnea 2005   Negative stress nuclear in 2005; normal echocardiogram   Gastroesophageal reflux disease    hiatal hernia; gastritis; dysphagia   Hemorrhoids    Inverted nipple left   always   Low back pain    Microscopic hematuria    Osteoporosis    Palpitations 2003   few short runs of supraventricular tachycardia on Holter   Spondylolisthesis of cervical region     Past Surgical History:  Procedure Laterality Date   CATARACT EXTRACTION  Bilateral    COLONOSCOPY  2011   COLONOSCOPY WITH PROPOFOL N/A 12/08/2020   Procedure: COLONOSCOPY WITH PROPOFOL;  Surgeon: Rogene Houston, MD;  Location: [facility];  Service: Endoscopy;  Laterality: N/A;  8:30   ROBOTIC ASSISTED BILATERAL SALPINGO OOPHERECTOMY Bilateral 04/05/2022   Procedure: Xi robotic assisted salpingo oophorectomy, vaginal biopsy;  Surgeon: [name], MD;  Location: [facility];  Service: Gynecology;  Laterality: Bilateral;   TONSILLECTOMY      Social History   Socioeconomic History   Marital status: Married    Spouse name: Not on file   Number of children: 2   Years of education: Not on file   Highest education level: Not on file  Occupational History   Occupation: Retired    Comment: office work  Tobacco Use   Smoking status: Never   Smokeless tobacco: Never  Scientific laboratory technician Use: Never used  Substance and Sexual Activity   Alcohol use: Yes    Comment: rarely   Drug use: No   Sexual activity: Not Currently    Birth control/protection: Post-menopausal  Other Topics Concern   Not on file  Social History Narrative   Not on file   Social Determinants of Health   Financial Resource Strain: [place]  (04/18/2021)   Overall Financial Resource Strain (CARDIA)    Difficulty of Paying Living Expenses: Not hard  at all  Food Insecurity: No Food Insecurity (04/18/2021)   Hunger Vital Sign    Worried About Running Out of Food in the Last Year: Never true    Ran Out of Food in the Last Year: Never true  Transportation Needs: No Transportation Needs (04/18/2021)   PRAPARE - Hydrologist (Medical): No    Lack of Transportation (Non-Medical): No  Physical Activity: Sufficiently Active (04/18/2021)   Exercise Vital Sign    Days of Exercise per Week: 6 days    Minutes of Exercise per Session: 50 min  Stress: No Stress Concern Present (04/18/2021)   [place]     Feeling of Stress : Not at all  Social Connections: [place] (04/18/2021)   Social Connection and Isolation Panel [NHANES]    Frequency of Communication with Friends and Family: More than three times a week    Frequency of Social Gatherings with Friends and Family: Twice a week    Attends Religious Services: More than 4 times per year    Active Member of [employer] or Organizations: Yes    Attends Music therapist: More than 4 times per year    Marital Status: Married     FAMILY HISTORY:  We obtained a detailed, 4-generation family history.  Significant diagnoses are listed below: Family History  Problem Relation Age of Onset   Cancer Father        pancreatic   Alzheimer's disease Father    Alzheimer's disease Sister    Breast cancer Paternal Aunt        52s   Alzheimer's disease Paternal Grandfather    Congenital heart disease Brother     Ms. Dejarnett is {aware/unaware} of previous family history of genetic testing for hereditary cancer risks. Patient's maternal ancestors are of *** descent, and paternal ancestors are of *** descent. There {IS NO:12509} reported Ashkenazi Jewish ancestry. There {IS NO:12509} known consanguinity.  GENETIC COUNSELING ASSESSMENT: Ms. Zolman is a 81 y.o. female with a {Personal/family:20331} history of {cancer/polyps} which is somewhat suggestive of a {DISEASE} and predisposition to cancer given ***. We, therefore, discussed and recommended the following at today's visit.   DISCUSSION: We discussed that *** - ***% of *** is hereditary, with most cases of hereditary *** cancer associated with ***.  There are other genes that can be associated with hereditary *** cancer syndromes.  These include ***.  We discussed that testing is beneficial for several reasons, including knowing about other cancer risks, identifying potential screening and risk-reduction options that may be appropriate, and to understanding if other family members could be at  risk for cancer and allowing them to undergo genetic testing.  We reviewed the characteristics, features and inheritance patterns of hereditary cancer syndromes. We also discussed genetic testing, including the appropriate family members to test, the process of testing, insurance coverage and turn-around-time for results. We discussed the implications of a negative, positive, carrier and/or variant of uncertain significant result. We discussed that negative results would be uninformative given that Ms. Bolen does not have a personal history of cancer. We recommended Ms. Auxier pursue genetic testing for a panel that contains genes associated with ***.  Ms. Fohl was offered a common hereditary cancer panel (48 genes) and an expanded pan-cancer panel (85 genes). Ms. Minteer was informed of the benefits and limitations of each panel, including that expanded pan-cancer panels contain several genes that do not have clear management guidelines  at this point in time.  We also discussed that as the number of genes included on a panel increases, the chances of variants of uncertain significance increases.  After considering the benefits and limitations of each gene panel, Ms. Ryall elected to have an *** through ***.   Based on Ms. Mulgrew's {Personal/family:20331} history of cancer, she meets medical criteria for genetic testing. Despite that she meets criteria, she may still have an out of pocket cost. We discussed that if her out of pocket cost for testing is over $100, the laboratory should contact them to discuss self-pay options and/or patient pay assistance programs.   ***We reviewed the characteristics, features and inheritance patterns of hereditary cancer syndromes. We also discussed genetic testing, including the appropriate family members to test, the process of testing, insurance coverage and turn-around-time for results. We discussed the implications of a negative, positive and/or variant of  uncertain significant result. In order to get genetic test results in a timely manner so that Ms. Dunsmore can use these genetic test results for surgical decisions, we recommended Ms. Waren pursue genetic testing for the ***. Once complete, we recommend Ms. Linares pursue reflex genetic testing to the *** gene panel.   Based on Ms. Laakso's {Personal/family:20331} history of cancer, she meets medical criteria for genetic testing. Despite that she meets criteria, she may still have an out of pocket cost.   ***We discussed with Ms. Pulido that the {Personal/family:20331} history does not meet insurance or NCCN criteria for genetic testing and, therefore, is not highly consistent with a familial hereditary cancer syndrome.  We feel she is at low risk to harbor a gene mutation associated with such a condition. Thus, we did not recommend any genetic testing, at this time, and recommended Ms. Herd continue to follow the cancer screening guidelines given by her primary healthcare provider.  ***In order to estimate her chance of having a {CA GENE:62345} mutation, we used statistical models ({GENMODELS:62370}) that consider her personal medical history, family history and ancestry.  Because each model is different, there can be a lot of variability in the risks they give.  Therefore, these numbers must be considered a rough range and not a precise risk of having a {CA GENE:62345} mutation.  These models estimate that she has approximately a ***-***% chance of having a mutation. Based on this assessment of her family and personal history, genetic testing {IS/ISNOT:34056} recommended.  ***Based on the patient's {Personal/family:20331} history, a statistical model ({GENMODELS:62370}) was used to estimate her risk of developing {CA HX:54794}. This estimates her lifetime risk of developing {CA HX:54794} to be approximately ***%. This estimation does not consider any genetic testing results.  The patient's lifetime  breast cancer risk is a preliminary estimate based on available information using one of several models endorsed by the [place] (ACS). The ACS recommends consideration of breast MRI screening as an adjunct to mammography for patients at high risk (defined as 20% or greater lifetime risk).   ***Ms. Plante has been determined to be at high risk for breast cancer.  Therefore, we recommend that annual screening with mammography and breast MRI be performed.  ***begin at age 37, or 10 years prior to the age of breast cancer diagnosis in a relative (whichever is earlier).  We discussed that Ms. Hofferber should discuss her individual situation with her referring physician and determine a breast cancer screening plan with which they are both comfortable.    We discussed that some people do not want to  undergo genetic testing due to fear of genetic discrimination.  A federal law called the Genetic Information Non-Discrimination Act (GINA) of 2008 helps protect individuals against genetic discrimination based on their genetic test results.  It impacts both health insurance and employment.  With health insurance, it protects against increased premiums, being kicked off insurance or being forced to take a test in order to be insured.  For employment it protects against hiring, firing and promoting decisions based on genetic test results.  GINA does not apply to those in the [employer], those who work for companies with less than 15 employees, and new life insurance or long-term disability insurance policies.  Health status due to a cancer diagnosis is not protected under GINA.  PLAN: After considering the risks, benefits, and limitations, Ms. Minetti provided informed consent to pursue genetic testing and the blood sample was sent to {Lab} Laboratories for analysis of the {test}. Results should be available within approximately {TAT TIME} weeks' time, at which point they will be disclosed by telephone to Ms.  Mcpeters, as will any additional recommendations warranted by these results. Ms. Friedlander will receive a summary of her genetic counseling visit and a copy of her results once available. This information will also be available in Epic.   *** Despite our recommendation, Ms. Kareem did not wish to pursue genetic testing at today's visit. We understand this decision and remain available to coordinate genetic testing at any time in the future. We, therefore, recommend Ms. Yates continue to follow the cancer screening guidelines given by her primary healthcare provider.  ***Based on Ms. Arbaugh's family history, we recommended her ***, who was diagnosed with *** at age ***, have genetic counseling and testing. Ms. Estell will let us know if we can be of any assistance in coordinating genetic counseling and/or testing for this family member.   Lastly, we encouraged Ms. Kathol to remain in contact with cancer genetics annually so that we can continuously update the family history and inform her of any changes in cancer genetics and testing that may be of benefit for this family.   Ms. Idler questions were answered to her satisfaction today. Our contact information was provided should additional questions or concerns arise. Thank you for the referral and allowing [place] to share in the care of your patient.   Kyden Potash M. Joette Catching, [place], [facility] Genetic Counselor [email or web address] (P) [phone]   The patient was seen for a total of *** minutes in face-to-face genetic counseling.  ***The was patient was accompanied by ***.  ***The patient was seen alone.  Drs. Lindi Adie and/or Burr Medico were available to discuss this case as needed.  _______________________________________________________________________ For Office Staff:  Number of people involved in session: *** Was an Intern/ student involved with case: {YES/NO:63}

## 2022-05-05 ENCOUNTER — Encounter: Payer: Self-pay | Admitting: Genetic Counselor

## 2022-05-05 DIAGNOSIS — Z803 Family history of malignant neoplasm of breast: Secondary | ICD-10-CM | POA: Insufficient documentation

## 2022-05-05 DIAGNOSIS — Z800 Family history of malignant neoplasm of digestive organs: Secondary | ICD-10-CM | POA: Insufficient documentation

## 2022-05-05 HISTORY — DX: Family history of malignant neoplasm of breast: Z80.3

## 2022-05-05 HISTORY — DX: Family history of malignant neoplasm of digestive organs: Z80.0

## 2022-05-15 DIAGNOSIS — H04123 Dry eye syndrome of bilateral lacrimal glands: Secondary | ICD-10-CM | POA: Diagnosis not present

## 2022-06-02 ENCOUNTER — Telehealth: Payer: Self-pay | Admitting: Genetic Counselor

## 2022-06-02 ENCOUNTER — Encounter: Payer: Self-pay | Admitting: Genetic Counselor

## 2022-06-02 ENCOUNTER — Ambulatory Visit: Payer: Self-pay | Admitting: Genetic Counselor

## 2022-06-02 DIAGNOSIS — Z803 Family history of malignant neoplasm of breast: Secondary | ICD-10-CM

## 2022-06-02 DIAGNOSIS — Z1379 Encounter for other screening for genetic and chromosomal anomalies: Secondary | ICD-10-CM | POA: Insufficient documentation

## 2022-06-02 DIAGNOSIS — Z800 Family history of malignant neoplasm of digestive organs: Secondary | ICD-10-CM

## 2022-06-02 NOTE — Progress Notes (Signed)
HPI:   Mckenzie Collins was previously seen in the [facility] due to a family history of breast and pancreatic cancer and concerns regarding a hereditary predisposition to cancer. Please refer to our prior cancer genetics clinic note for more information regarding our discussion, assessment and recommendations, at the time. Mckenzie Collins recent genetic test results were disclosed to her, as were recommendations warranted by these results. These results and recommendations are discussed in more detail below.  CANCER HISTORY:  Mckenzie Collins is a 81 y.o. female with no personal history of cancer.     FAMILY HISTORY:  We obtained a detailed, 4-generation family history.  Significant diagnoses are listed below:      Family History  Problem Relation Age of Onset   Pancreatic cancer Father          dx early 77s   Breast cancer Paternal Aunt          dx 52s       Mckenzie Collins is unaware of previous family history of genetic testing for hereditary cancer risks. There is no reported Ashkenazi Jewish ancestry. There is no known consanguinity.  GENETIC TEST RESULTS:  The [facility] CustomNext-Cancer +RNAinsight Panel found no pathogenic mutations.   The CustomNext-Cancer+RNAinsight panel offered by [facility] includes sequencing and rearrangement analysis for the following 47 genes:  APC, ATM, AXIN2, BARD1, BMPR1A, BRCA1, BRCA2, BRIP1, CDH1, CDK4, CDKN2A, CHEK2, DICER1, EPCAM, GREM1, HOXB13, MEN1, MLH1, MSH2, MSH3, MSH6, MUTYH, NBN, NF1, NF2, NTHL1, PALB2, PMS2, POLD1, POLE, PTEN, RAD51C, RAD51D, RECQL, RET, SDHA, SDHAF2, SDHB, SDHC, SDHD, SMAD4, SMARCA4, STK11, TP53, TSC1, TSC2, and VHL.  RNA data is routinely analyzed for use in variant interpretation for all genes. .   The test report will be scanned into EPIC and is located under the Molecular Pathology section of the Results Review tab.  A portion of the result report is included below for reference. Genetic testing reported out on  May 29, 2022.      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  At this time, it is unknown if this variant is associated with an increased risk for cancer or if it is benign, but most uncertain variants are reclassified to benign. It should not be used to make medical management decisions. With time, we suspect the laboratory will determine the significance of this variant, if any. If the laboratory reclassifies this variant, we will attempt to contact Mckenzie Collins to discuss it further.   Even though a pathogenic variant was not identified, possible explanations for the cancer in the family may include: There may be no hereditary risk for cancer in the family. The cancers in Mckenzie Collins's family may be sporadic/familial or due to other genetic and environmental factors. There may be a gene mutation in one of these genes that current testing methods cannot detect but that chance is small. There could be another gene that has not yet been discovered, or that we have not yet tested, that is responsible for the cancer diagnoses in the family.  It is also possible there is a hereditary cause for the cancer in the family that Mckenzie Collins did not inherit.   Therefore, it is important to remain in touch with cancer genetics in the future so that we can continue to offer Mckenzie Collins the most up to date genetic testing.    ADDITIONAL GENETIC TESTING:  We discussed with Mckenzie Collins that her  genetic testing was fairly extensive.  If there are additional relevant genes identified to increase cancer risk that can be analyzed in the future, we would be happy to discuss and coordinate this testing at that time.      CANCER SCREENING RECOMMENDATIONS:  Mckenzie Collins test result is considered negative (normal).  This means that we have not identified a hereditary cause for her family history of pancreatic cancer at this time.   An  individual's cancer risk and medical management are not determined by genetic test results alone. Overall cancer risk assessment incorporates additional factors, including personal medical history, family history, and any available genetic information that may result in a personalized plan for cancer prevention and surveillance. Therefore, it is recommended she continue to follow the cancer management and screening guidelines provided by her primary healthcare provider.  RECOMMENDATIONS FOR FAMILY MEMBERS:   Since she did not inherit a identifiable mutation in a cancer predisposition gene included on this panel, her children could not have inherited a known mutation from her in one of these genes. Individuals in this family might be at some increased risk of developing cancer, over the general population risk, due to the family history of cancer.  Individuals in the family should notify their providers of the family history of cancer. We recommend women in this family have a yearly mammogram beginning at age 23, or 78 years younger than the earliest onset of cancer, an annual clinical breast exam, and perform monthly breast self-exams.   Other members of the family may still carry a pathogenic variant in one of these genes that Mckenzie Collins did not inherit. Based on the family history of pancreatic cancer in her deceased father, we recommend her siblings have genetic counseling and testing. Mckenzie Collins can let us know if we can be of any assistance in coordinating genetic counseling and/or testing for this family member.   We do not recommend familial testing for the TSC2 variant of uncertain significance (VUS).  FOLLOW-UP:  Lastly, we discussed with Mckenzie Collins that cancer genetics is a rapidly advancing field and it is possible that new genetic tests will be appropriate for her and/or her family members in the future. We encouraged her to remain in contact with cancer genetics on an annual basis so we can  update her personal and family histories and let her know of advances in cancer genetics that may benefit this family.   Our contact number was provided. Mckenzie Collins questions were answered to her satisfaction, and she knows she is welcome to call us at anytime with additional questions or concerns.   Tynia Wiers M. Joette Catching, [place], [facility] Genetic Counselor [email or web address] (P) [phone]

## 2022-06-02 NOTE — Telephone Encounter (Signed)
Revealed negative genetic testing and VUS in TSC2.  Discussed that we do not know why there is cancer in the family. It could be sporadic/famillial, due to a change in a gene that she did not inherit, due to a different gene that we are not testing, or maybe our current technology may not be able to pick something up.  It will be important for her to keep in contact with genetics to keep up with whether additional testing may be needed.

## 2022-06-19 ENCOUNTER — Encounter: Payer: Self-pay | Admitting: Genetic Counselor

## 2022-07-11 ENCOUNTER — Other Ambulatory Visit (HOSPITAL_COMMUNITY): Payer: Self-pay | Admitting: Internal Medicine

## 2022-07-11 ENCOUNTER — Other Ambulatory Visit: Payer: Self-pay | Admitting: Internal Medicine

## 2022-07-11 DIAGNOSIS — R42 Dizziness and giddiness: Secondary | ICD-10-CM

## 2022-07-11 DIAGNOSIS — M6281 Muscle weakness (generalized): Secondary | ICD-10-CM | POA: Diagnosis not present

## 2022-07-11 DIAGNOSIS — R2689 Other abnormalities of gait and mobility: Secondary | ICD-10-CM

## 2022-07-11 DIAGNOSIS — R2681 Unsteadiness on feet: Secondary | ICD-10-CM | POA: Diagnosis not present

## 2022-07-24 DIAGNOSIS — M6281 Muscle weakness (generalized): Secondary | ICD-10-CM | POA: Diagnosis not present

## 2022-07-24 DIAGNOSIS — R42 Dizziness and giddiness: Secondary | ICD-10-CM | POA: Diagnosis not present

## 2022-07-24 DIAGNOSIS — R2681 Unsteadiness on feet: Secondary | ICD-10-CM | POA: Diagnosis not present

## 2022-07-27 ENCOUNTER — Ambulatory Visit (HOSPITAL_COMMUNITY): Payer: Medicare Other

## 2022-08-15 ENCOUNTER — Ambulatory Visit (HOSPITAL_COMMUNITY)
Admission: RE | Admit: 2022-08-15 | Discharge: 2022-08-15 | Disposition: A | Discharge status: Home / Self Care | Payer: Medicare Other | Source: Ambulatory Visit | Attending: Internal Medicine | Admitting: Internal Medicine

## 2022-08-15 DIAGNOSIS — R2689 Other abnormalities of gait and mobility: Secondary | ICD-10-CM | POA: Diagnosis not present

## 2022-08-15 DIAGNOSIS — R42 Dizziness and giddiness: Secondary | ICD-10-CM | POA: Insufficient documentation

## 2022-09-14 ENCOUNTER — Encounter: Payer: Self-pay | Admitting: Neurology

## 2022-09-14 ENCOUNTER — Ambulatory Visit: Payer: Medicare Other | Admitting: Neurology

## 2022-09-14 VITALS — BP 148/78 | HR 88 | Ht 63.0 in | Wt 134.0 lb

## 2022-09-14 DIAGNOSIS — I951 Orthostatic hypotension: Secondary | ICD-10-CM

## 2022-09-14 DIAGNOSIS — M542 Cervicalgia: Secondary | ICD-10-CM

## 2022-09-14 NOTE — Progress Notes (Signed)
[facility]  PATIENT: Mckenzie Collins DOB: 09-Jun-1941  REQUESTING CLINICIAN: Asencion Noble, MD HISTORY FROM: Patient  REASON FOR VISIT: Dizziness/Leg weakness    HISTORICAL  CHIEF COMPLAINT:  Chief Complaint  Patient presents with   New Patient (Initial Visit)    Rm 13, alone NP/Paper/Roy Fagan MD [phone] of balance, dizziness, leg weakness Reports 1 fall 8 months ago, states balance issues and leg weakness has worsen with the last year  lying down -BP 148/78 P98 Standing BP 113/74 P 116    HISTORY OF PRESENT ILLNESS:  This is a 81 year old woman past medical history of anxiety, asthma, family history of dementia who is presenting with complaint of dizziness, lightheadedness upon standing.  Patient reports the symptoms started in 2020 when she was first having sensitivity to her teeth and also to loud noise.  She did follow-up with dentist and was told everything was okay, she follow-up with audiologist was told that she has mild hearing loss.  She reported feeling off balance and feeling dizzy upon standing, denies any fall, said that her legs are weak.  She tried to move slower, being careful about going down step and sometimes she feel like she is going to faint but again no passing out.  She does also complain of neck pain/strain, she did  completed physical therapy for her neck, which was helpful but currently not doing the neck exercises at home.  She does use heating pad, neck massage, she does have a muscle relaxant but she is not using.    OTHER MEDICAL CONDITIONS: Asthma, Anxiety,    REVIEW OF SYSTEMS: Full 14 system review of systems performed and negative with exception of: As noted in the HPI   ALLERGIES: No Known Allergies  HOME MEDICATIONS: Outpatient Medications Prior to Visit  Medication Sig Dispense Refill   albuterol (VENTOLIN HFA) 108 (90 Base) MCG/ACT inhaler Inhale 2 puffs into the lungs every 4 (four) hours as needed for  wheezing or shortness of breath.     budesonide-formoterol (SYMBICORT) 80-4.5 MCG/ACT inhaler Inhale 2 puffs into the lungs 2 (two) times daily. (Patient taking differently: Inhale 1-2 puffs into the lungs every morning.) 1 Inhaler 11   Calcium Carb-Cholecalciferol (CALTRATE 600+D3 PO) Take 1 tablet by mouth in the morning and at bedtime.     calcium elemental as carbonate (TUMS ULTRA 1000) 400 MG chewable tablet Chew 1,000 mg by mouth daily as needed for heartburn.     cholecalciferol (VITAMIN D3) 25 MCG (1000 UNIT) tablet Take 1,000 Units by mouth daily.     ibuprofen (ADVIL) 200 MG tablet Take 200 mg by mouth every 6 (six) hours as needed.     loratadine (CLARITIN) 10 MG tablet Take 10 mg by mouth daily.     Multiple Vitamins-Minerals (CENTRUM SILVER ULTRA WOMENS PO) Take 1 tablet by mouth daily.     Propylene Glycol (SYSTANE BALANCE) 0.6 % SOLN Place 1 drop into both eyes 2 (two) times daily as needed (dry eyes).     acetaminophen (TYLENOL) 325 MG tablet Take 650 mg by mouth every 6 (six) hours as needed for moderate pain.     famotidine (PEPCID) 20 MG tablet Take 20 mg by mouth daily as needed for heartburn or indigestion.     MAGNESIUM GLYCINATE PO Take 1 tablet by mouth 3 (three) times a week.     No facility-administered medications prior to visit.    PAST MEDICAL HISTORY: Past Medical History:  Diagnosis Date   Anxiety  Asthma    uses symbicort, uses albuterol maybe once a month   Breast mass left   skin lesion around 11-12 x 2 -3 weeks   Chest pain    history of   Dyspnea 2005   Negative stress nuclear in 2005; normal echocardiogram   Family history of breast cancer 05/05/2022   Family history of pancreatic cancer 05/05/2022   Gastroesophageal reflux disease    hiatal hernia; gastritis; dysphagia   Hemorrhoids    Inverted nipple left   always   Low back pain    Microscopic hematuria    Osteoporosis    Palpitations 2003   few short runs of supraventricular tachycardia  on Holter   Spondylolisthesis of cervical region     PAST SURGICAL HISTORY: Past Surgical History:  Procedure Laterality Date   CATARACT EXTRACTION Bilateral    COLONOSCOPY  2011   COLONOSCOPY WITH PROPOFOL N/A 12/08/2020   Procedure: COLONOSCOPY WITH PROPOFOL;  Surgeon: Rogene Houston, MD;  Location: [facility];  Service: Endoscopy;  Laterality: N/A;  8:30   ROBOTIC ASSISTED BILATERAL SALPINGO OOPHERECTOMY Bilateral 04/05/2022   Procedure: Xi robotic assisted salpingo oophorectomy, vaginal biopsy;  Surgeon: [name], MD;  Location: [facility];  Service: Gynecology;  Laterality: Bilateral;   TONSILLECTOMY      FAMILY HISTORY: Family History  Problem Relation Age of Onset   Pancreatic cancer Father        dx early 81s   Alzheimer's disease Father    Alzheimer's disease Sister    Congenital heart disease Brother    Breast cancer Paternal Aunt        dx 61s   Alzheimer's disease Paternal Grandfather     SOCIAL HISTORY: Social History   Socioeconomic History   Marital status: Married    Spouse name: Not on file   Number of children: 2   Years of education: Not on file   Highest education level: Not on file  Occupational History   Occupation: Retired    Comment: office work  Tobacco Use   Smoking status: Never   Smokeless tobacco: Never  Scientific laboratory technician Use: Never used  Substance and Sexual Activity   Alcohol use: Yes    Comment: rarely   Drug use: No   Sexual activity: Not Currently    Birth control/protection: Post-menopausal  Other Topics Concern   Not on file  Social History Narrative   Not on file   Social Determinants of Health   Financial Resource Strain: Low Risk  (04/18/2021)   Overall Financial Resource Strain (CARDIA)    Difficulty of Paying Living Expenses: Not hard at all  Food Insecurity: No Food Insecurity (04/18/2021)   Hunger Vital Sign    Worried About Running Out of Food in the Last Year: Never true    [place] in the  Last Year: Never true  Transportation Needs: No Transportation Needs (04/18/2021)   PRAPARE - Hydrologist (Medical): No    Lack of Transportation (Non-Medical): No  Physical Activity: Sufficiently Active (04/18/2021)   Exercise Vital Sign    Days of Exercise per Week: 6 days    Minutes of Exercise per Session: 50 min  Stress: No Stress Concern Present (04/18/2021)   [place]    Feeling of Stress : Not at all  Social Connections: [place] (04/18/2021)   Social Connection and Isolation Panel [NHANES]  Frequency of Communication with Friends and Family: More than three times a week    Frequency of Social Gatherings with Friends and Family: Twice a week    Attends Religious Services: More than 4 times per year    Active Member of [employer] or Organizations: Yes    Attends Music therapist: More than 4 times per year    Marital Status: Married  Human resources officer Violence: Not At Risk (04/18/2021)   Humiliation, Afraid, Rape, and Kick questionnaire    Fear of Current or Ex-Partner: No    Emotionally Abused: No    Physically Abused: No    Sexually Abused: No    PHYSICAL EXAM  GENERAL EXAM/CONSTITUTIONAL: Vitals:  Vitals:   09/14/22 0856  BP: (!) 148/78  Pulse: 88  Weight: 134 lb (60.8 kg)  Height: '5\' 3"'$  (1.6 m)   Body mass index is 23.74 kg/m. Wt Readings from Last 3 Encounters:  09/14/22 134 lb (60.8 kg)  05/01/22 135 lb (61.2 kg)  04/05/22 132 lb 7.9 oz (60.1 kg)   Patient is in no distress; well developed, nourished and groomed; neck is supple. She appears anxious, tearful during evaluation.   EYES: Pupils round and reactive to light, Visual fields full to confrontation, Extraocular movements intacts,   MUSCULOSKELETAL: Gait, strength, tone, movements noted in Neurologic exam below  NEUROLOGIC: MENTAL STATUS:      No data to display         awake,  alert, oriented to person, place and time recent and remote memory intact normal attention and concentration language fluent, comprehension intact, naming intact fund of knowledge appropriate  CRANIAL NERVE:  2nd, 3rd, 4th, 6th - pupils equal and reactive to light, visual fields full to confrontation, extraocular muscles intact, no nystagmus 5th - facial sensation symmetric 7th - facial strength symmetric 8th - hearing intact 9th - palate elevates symmetrically, uvula midline 11th - shoulder shrug symmetric 12th - tongue protrusion midline  MOTOR:  normal bulk and tone, full strength in the BUE, BLE  SENSORY:  normal and symmetric to light touch  COORDINATION:  finger-nose-finger, fine finger movements normal  REFLEXES:  deep tendon reflexes present and symmetric  GAIT/STATION:  normal  DIAGNOSTIC DATA (LABS, IMAGING, TESTING) - I reviewed patient records, labs, notes, testing and imaging myself where available.  Lab Results  Component Value Date   WBC 5.9 03/31/2022   HGB 12.8 03/31/2022   HCT 39.4 03/31/2022   MCV 95.9 03/31/2022   PLT 252 03/31/2022      Component Value Date/Time   NA 140 03/31/2022 0955   K 4.5 03/31/2022 0955   CL 108 03/31/2022 0955   CO2 27 03/31/2022 0955   GLUCOSE 96 03/31/2022 0955   BUN 24 (H) 03/31/2022 0955   CREATININE 1.10 (H) 03/31/2022 0955   CALCIUM 9.4 03/31/2022 0955   PROT 7.5 03/31/2022 0955   ALBUMIN 4.1 03/31/2022 0955   AST 21 03/31/2022 0955   ALT 17 03/31/2022 0955   ALKPHOS 37 (L) 03/31/2022 0955   BILITOT 0.8 03/31/2022 0955   GFRNONAA 51 (L) 03/31/2022 0955   No results found for: "CHOL", "HDL", "LDLCALC", "LDLDIRECT", "TRIG", "CHOLHDL" No results found for: "HGBA1C" No results found for: "VITAMINB12" Lab Results  Component Value Date   TSH 2.17 01/08/2018    MRI Brain 08/15/22 No evidence of acute intracranial abnormality. Mild chronic small vessel ischemic changes within the cerebral white  matter    ASSESSMENT AND PLAN  81 y.o. year old female  with history of asthma and anxiety who is presenting with dizziness, feeling off balance but no falls.  On exam today she was found to have a drop in blood pressure of 35 points consistent with orthostatic hypotension, her blood pressure laying down was 148/78 and standing up 113/74.  We also went over her recent MRI brain which showed no acute intracranial abnormality that can explain her dizziness.  I did advise patient to increase her fluid intake, and electrolytes, Pedialyte or Gatorade.  She voices understanding.  For her Neck pain, I did advise her to continue the neck exercises learned from physical therapy, continue with heating pad, neck massages and also use the muscle relaxant as needed.  Continue to follow-up with primary care doctor and return as needed.     1. Orthostatic hypotension   2. Neck pain      Patient Instructions  Increase water intake, can add electrolytes, Pedialyte, Gatorade  Continue current medications  Restart Neck exercise learn during your time in Physical therapy  Increase exercise  Return as needed   No orders of the defined types were placed in this encounter.   No orders of the defined types were placed in this encounter.   Return if symptoms worsen or fail to improve.  I have spent a total of 45 minutes dedicated to this patient today, preparing to see patient, performing a medically appropriate examination and evaluation, ordering tests and/or medications and procedures, and counseling and educating the patient/family/caregiver; independently interpreting result and communicating results to the family/patient/caregiver; and documenting clinical information in the electronic medical record.   Alric Ran, MD 09/14/2022, 9:42 AM  [facility] [address] [place], [place] [address] [phone]

## 2022-09-14 NOTE — Patient Instructions (Signed)
Increase water intake, can add electrolytes, Pedialyte, Gatorade  Continue current medications  Restart Neck exercise learn during your time in Physical therapy  Increase exercise  Return as needed

## 2022-10-09 DIAGNOSIS — Z79899 Other long term (current) drug therapy: Secondary | ICD-10-CM | POA: Diagnosis not present

## 2022-10-09 DIAGNOSIS — M810 Age-related osteoporosis without current pathological fracture: Secondary | ICD-10-CM | POA: Diagnosis not present

## 2022-10-09 DIAGNOSIS — R000 Tachycardia, unspecified: Secondary | ICD-10-CM | POA: Diagnosis not present

## 2022-10-09 DIAGNOSIS — J45901 Unspecified asthma with (acute) exacerbation: Secondary | ICD-10-CM | POA: Diagnosis not present

## 2022-10-09 DIAGNOSIS — K219 Gastro-esophageal reflux disease without esophagitis: Secondary | ICD-10-CM | POA: Diagnosis not present

## 2022-10-16 ENCOUNTER — Encounter: Payer: Self-pay | Admitting: Orthopedic Surgery

## 2022-10-16 DIAGNOSIS — Z6741 Type O blood, Rh negative: Secondary | ICD-10-CM | POA: Diagnosis not present

## 2022-10-16 DIAGNOSIS — K219 Gastro-esophageal reflux disease without esophagitis: Secondary | ICD-10-CM | POA: Diagnosis not present

## 2022-10-16 DIAGNOSIS — I700 Atherosclerosis of aorta: Secondary | ICD-10-CM | POA: Diagnosis not present

## 2022-11-22 ENCOUNTER — Ambulatory Visit: Payer: Medicare Other | Admitting: Orthopaedic Surgery

## 2022-12-04 ENCOUNTER — Ambulatory Visit (INDEPENDENT_AMBULATORY_CARE_PROVIDER_SITE_OTHER): Payer: Medicare Other

## 2022-12-04 ENCOUNTER — Ambulatory Visit: Payer: Medicare Other | Admitting: Orthopedic Surgery

## 2022-12-04 DIAGNOSIS — M25551 Pain in right hip: Secondary | ICD-10-CM

## 2022-12-04 DIAGNOSIS — M7062 Trochanteric bursitis, left hip: Secondary | ICD-10-CM

## 2022-12-04 DIAGNOSIS — M7061 Trochanteric bursitis, right hip: Secondary | ICD-10-CM | POA: Diagnosis not present

## 2022-12-04 DIAGNOSIS — M25552 Pain in left hip: Secondary | ICD-10-CM

## 2022-12-04 MED ORDER — METHYLPREDNISOLONE ACETATE 40 MG/ML IJ SUSP
40.0000 mg | Freq: Once | INTRAMUSCULAR | Status: AC
  Administered 2022-12-04: 40 mg via INTRA_ARTICULAR

## 2022-12-04 NOTE — Patient Instructions (Addendum)

## 2022-12-04 NOTE — Progress Notes (Signed)
Patient ID: Mckenzie Collins, female   DOB: February 21, 1941, 82 y.o.   MRN: [id number]  SUMMARY AND PLAN  82 year old female by history and physical examination appears to have bilateral hip bursitis recommend bilateral hip injections  Procedure note injection for left hip bursitis  Verbal consent was obtained for injection of the  left hip   Timeout was completed to confirm the injection site  The medications used were 40 mg of Depo-Medrol and 1% lidocaine 3 cc  Anesthesia was provided by ethyl chloride and the skin was prepped with alcohol.  After cleaning the skin with alcohol a 25-gauge needle was used to inject the left hip greater trochanteric bursa Procedure note injection for right hip bursitis  Verbal consent was obtained for injection of the right hip  Timeout was completed to confirm the injection site  The medications used were 40 mg of Depo-Medrol and 1% lidocaine 3 cc  Anesthesia was provided by ethyl chloride and the skin was prepped with alcohol.  After cleaning the skin with alcohol a 25-gauge needle was used to inject the greater trochanteric bursa right hip   No complications were noted    Chief Complaint  Patient presents with   Hip Pain    Bilateral hip pain    HPI  82 year old female with bilateral hip pain seems that the right is worse than the left.  The patient walks anywhere from 30 minutes to an hour per day less since she had surgery this year.  However she has noted progressively worsening pain in her left hip especially when going up the stairs or laying on the right or left side.  She denies back pain or groin pain there is no radiculopathy    No Known Allergies  Current Outpatient Medications  Medication Instructions   albuterol (VENTOLIN HFA) 108 (90 Base) MCG/ACT inhaler 2 puffs, Inhalation, Every 4 hours PRN   budesonide-formoterol (SYMBICORT) 80-4.5 MCG/ACT inhaler 2 puffs, Inhalation, 2 times daily   Calcium Carb-Cholecalciferol (CALTRATE  600+D3 PO) 1 tablet, Oral, 2 times daily   cholecalciferol (VITAMIN D3) 1,000 Units, Oral, Daily   ibuprofen (ADVIL) 200 mg, Oral, Every 6 hours PRN   loratadine (CLARITIN) 10 mg, Oral, Daily   Multiple Vitamins-Minerals (CENTRUM SILVER ULTRA WOMENS PO) 1 tablet, Oral, Daily,     Propylene Glycol (SYSTANE BALANCE) 0.6 % SOLN 1 drop, Both Eyes, 2 times daily PRN   Tums Ultra 1000 1,000 mg, Oral, Daily PRN     Review of Systems Review of Systems  Constitutional:  Negative for appetite change and fever.  Gastrointestinal: Negative.   Genitourinary: Negative.   Neurological:  Negative for weakness and numbness.     Past Medical History:  Diagnosis Date   Anxiety    Asthma    uses symbicort, uses albuterol maybe once a month   Breast mass left   skin lesion around 11-12 x 2 -3 weeks   Chest pain    history of   Dyspnea 2005   Negative stress nuclear in 2005; normal echocardiogram   Family history of breast cancer 05/05/2022   Family history of pancreatic cancer 05/05/2022   Gastroesophageal reflux disease    hiatal hernia; gastritis; dysphagia   Hemorrhoids    Inverted nipple left   always   Low back pain    Microscopic hematuria    Osteoporosis    Palpitations 2003   few short runs of supraventricular tachycardia on Holter   Spondylolisthesis of cervical region  Past Surgical History:  Procedure Laterality Date   CATARACT EXTRACTION Bilateral    COLONOSCOPY  2011   COLONOSCOPY WITH PROPOFOL N/A 12/08/2020   Procedure: COLONOSCOPY WITH PROPOFOL;  Surgeon: Rogene Houston, MD;  [facility]: [facility];  Service: Endoscopy;  Laterality: N/A;  8:30   ROBOTIC ASSISTED BILATERAL SALPINGO OOPHERECTOMY Bilateral 04/05/2022   Procedure: Xi robotic assisted salpingo oophorectomy, vaginal biopsy;  Surgeon: [name], MD;  Location: [facility];  Service: Gynecology;  Laterality: Bilateral;   TONSILLECTOMY      Family History  Problem Relation Age of Onset   Pancreatic  cancer Father        dx early 51s   Alzheimer's disease Father    Alzheimer's disease Sister    Congenital heart disease Brother    Breast cancer Paternal Aunt        dx 64s   Alzheimer's disease Paternal Grandfather      Social History   Tobacco Use   Smoking status: Never   Smokeless tobacco: Never  Vaping Use   Vaping Use: Never used  Substance Use Topics   Alcohol use: Yes    Comment: rarely   Drug use: No    No Known Allergies  Current Outpatient Medications  Medication Sig Dispense Refill   albuterol (VENTOLIN HFA) 108 (90 Base) MCG/ACT inhaler Inhale 2 puffs into the lungs every 4 (four) hours as needed for wheezing or shortness of breath.     budesonide-formoterol (SYMBICORT) 80-4.5 MCG/ACT inhaler Inhale 2 puffs into the lungs 2 (two) times daily. (Patient taking differently: Inhale 1-2 puffs into the lungs every morning.) 1 Inhaler 11   Calcium Carb-Cholecalciferol (CALTRATE 600+D3 PO) Take 1 tablet by mouth in the morning and at bedtime.     calcium elemental as carbonate (TUMS ULTRA 1000) 400 MG chewable tablet Chew 1,000 mg by mouth daily as needed for heartburn.     cholecalciferol (VITAMIN D3) 25 MCG (1000 UNIT) tablet Take 1,000 Units by mouth daily.     ibuprofen (ADVIL) 200 MG tablet Take 200 mg by mouth every 6 (six) hours as needed.     loratadine (CLARITIN) 10 MG tablet Take 10 mg by mouth daily.     Multiple Vitamins-Minerals (CENTRUM SILVER ULTRA WOMENS PO) Take 1 tablet by mouth daily.     Propylene Glycol (SYSTANE BALANCE) 0.6 % SOLN Place 1 drop into both eyes 2 (two) times daily as needed (dry eyes).     No current facility-administered medications for this visit.       Physical Exam There were no vitals taken for this visit.   General appearance normal  Alert and oriented 3  Mood and affect normal    Upper extremities: Normal alignment  Ambulatory status: LIMP: y/n no  lumbar spine exam nontender  Examination of the left and  right hip hip reveals moderate to severe tenderness over the greater trochanter  Hip flexion is normal Internal rotation is normal External rotation is normal Hip stability is normal  Log roll maneuver normal Push pull test is normal  Hip flexion strength 5/5 Leg lengths are normal and equal  Overall alignment is normal Neurovascular examination normal sensation in the 2 lower extremities with normal pulses and perfusion, no peripheral edema, normal color    MEDICAL DECISION SECTION  OUTSIDE NOTES (Y/N) no  SUMMARY OF OUTSIDE NOTES: Not applicable  Prior x-ray reports are included by reference  Imaging: I will interpret outside images separately no images were obtained externally.  Internal images show normal pelvis and hip joints right and left  Mild degenerative changes lumbar spine  Encounter Diagnoses  Name Primary?   Bilateral hip pain Yes   Trochanteric bursitis, left hip    Trochanteric bursitis, right hip      PLAN: Bilateral hip injections.  The patient will wait 2 to 3 weeks and then call me if she needs another injection  Meds ordered this encounter  Medications   methylPREDNISolone acetate (DEPO-MEDROL) injection 40 mg   methylPREDNISolone acetate (DEPO-MEDROL) injection 40 mg

## 2022-12-13 DIAGNOSIS — D225 Melanocytic nevi of trunk: Secondary | ICD-10-CM | POA: Diagnosis not present

## 2022-12-13 DIAGNOSIS — Z1283 Encounter for screening for malignant neoplasm of skin: Secondary | ICD-10-CM | POA: Diagnosis not present

## 2022-12-13 DIAGNOSIS — L308 Other specified dermatitis: Secondary | ICD-10-CM | POA: Diagnosis not present

## 2023-01-08 ENCOUNTER — Other Ambulatory Visit: Payer: Self-pay | Admitting: Internal Medicine

## 2023-01-08 DIAGNOSIS — Z1231 Encounter for screening mammogram for malignant neoplasm of breast: Secondary | ICD-10-CM

## 2023-01-09 DIAGNOSIS — E785 Hyperlipidemia, unspecified: Secondary | ICD-10-CM | POA: Diagnosis not present

## 2023-01-11 ENCOUNTER — Encounter: Payer: Self-pay | Admitting: Radiology

## 2023-01-16 DIAGNOSIS — I700 Atherosclerosis of aorta: Secondary | ICD-10-CM | POA: Diagnosis not present

## 2023-01-16 DIAGNOSIS — E785 Hyperlipidemia, unspecified: Secondary | ICD-10-CM | POA: Diagnosis not present

## 2023-01-17 ENCOUNTER — Encounter: Payer: Self-pay | Admitting: Internal Medicine

## 2023-01-17 ENCOUNTER — Other Ambulatory Visit: Payer: Self-pay | Admitting: Internal Medicine

## 2023-01-17 DIAGNOSIS — Z780 Asymptomatic menopausal state: Secondary | ICD-10-CM

## 2023-02-22 ENCOUNTER — Ambulatory Visit
Admission: RE | Admit: 2023-02-22 | Discharge: 2023-02-22 | Disposition: A | Discharge status: Home / Self Care | Payer: Medicare Other | Source: Ambulatory Visit | Attending: Internal Medicine | Admitting: Internal Medicine

## 2023-02-22 DIAGNOSIS — Z1231 Encounter for screening mammogram for malignant neoplasm of breast: Secondary | ICD-10-CM | POA: Diagnosis not present

## 2023-07-25 ENCOUNTER — Ambulatory Visit
Admission: RE | Admit: 2023-07-25 | Discharge: 2023-07-25 | Disposition: A | Discharge status: Home / Self Care | Payer: Medicare Other | Source: Ambulatory Visit | Attending: Internal Medicine | Admitting: Internal Medicine

## 2023-07-25 DIAGNOSIS — E349 Endocrine disorder, unspecified: Secondary | ICD-10-CM | POA: Diagnosis not present

## 2023-07-25 DIAGNOSIS — Z780 Asymptomatic menopausal state: Secondary | ICD-10-CM

## 2023-07-25 DIAGNOSIS — M8588 Other specified disorders of bone density and structure, other site: Secondary | ICD-10-CM | POA: Diagnosis not present

## 2023-08-01 DIAGNOSIS — M791 Myalgia, unspecified site: Secondary | ICD-10-CM | POA: Diagnosis not present

## 2023-08-01 DIAGNOSIS — M810 Age-related osteoporosis without current pathological fracture: Secondary | ICD-10-CM | POA: Diagnosis not present

## 2023-08-01 DIAGNOSIS — Z23 Encounter for immunization: Secondary | ICD-10-CM | POA: Diagnosis not present

## 2023-08-06 DIAGNOSIS — M791 Myalgia, unspecified site: Secondary | ICD-10-CM | POA: Diagnosis not present

## 2023-08-07 ENCOUNTER — Other Ambulatory Visit: Payer: Self-pay

## 2023-08-08 ENCOUNTER — Telehealth: Payer: Self-pay

## 2023-08-08 NOTE — Telephone Encounter (Signed)
Auth Submission: APPROVED Site of care: Site of care: [facility] Payer: [employer] medicare Medication & CPT/J Code(s) submitted: Prolia (Denosumab) [id number] Route of submission (phone, fax, portal): portal Phone # Fax # Auth type: Buy/Bill PB Units/visits requested: 60mg  x 2 doses Reference number: [id number] Approval from: 08/07/23 to 08/06/24

## 2023-08-27 ENCOUNTER — Encounter: Payer: Medicare Other | Attending: Internal Medicine | Admitting: *Deleted

## 2023-08-27 VITALS — BP 130/65 | HR 89 | Temp 97.7°F | Resp 18

## 2023-08-27 DIAGNOSIS — M810 Age-related osteoporosis without current pathological fracture: Secondary | ICD-10-CM | POA: Diagnosis not present

## 2023-08-27 MED ORDER — DENOSUMAB 60 MG/ML ~~LOC~~ SOSY
60.0000 mg | PREFILLED_SYRINGE | Freq: Once | SUBCUTANEOUS | Status: AC
  Administered 2023-08-27: 60 mg via SUBCUTANEOUS

## 2023-08-27 NOTE — Progress Notes (Signed)
Diagnosis: Osteoporosis  Provider:  Carylon Perches MD  Procedure: Injection  Prolia (Denosumab), Dose: 60 mg, Site: subcutaneous, Number of injections: 1  Post Care: Observation period completed  Discharge: Condition: Good, Destination: Home . AVS Provided  Performed by:  Daleen Squibb, RN

## 2023-09-27 ENCOUNTER — Ambulatory Visit (INDEPENDENT_AMBULATORY_CARE_PROVIDER_SITE_OTHER): Payer: Medicare Other

## 2023-10-15 DIAGNOSIS — J45901 Unspecified asthma with (acute) exacerbation: Secondary | ICD-10-CM | POA: Diagnosis not present

## 2023-10-15 DIAGNOSIS — K219 Gastro-esophageal reflux disease without esophagitis: Secondary | ICD-10-CM | POA: Diagnosis not present

## 2023-10-15 DIAGNOSIS — I951 Orthostatic hypotension: Secondary | ICD-10-CM | POA: Diagnosis not present

## 2023-10-15 DIAGNOSIS — I2510 Atherosclerotic heart disease of native coronary artery without angina pectoris: Secondary | ICD-10-CM | POA: Diagnosis not present

## 2023-10-15 DIAGNOSIS — M810 Age-related osteoporosis without current pathological fracture: Secondary | ICD-10-CM | POA: Diagnosis not present

## 2023-10-15 DIAGNOSIS — I700 Atherosclerosis of aorta: Secondary | ICD-10-CM | POA: Diagnosis not present

## 2023-10-22 DIAGNOSIS — Z0001 Encounter for general adult medical examination with abnormal findings: Secondary | ICD-10-CM | POA: Diagnosis not present

## 2023-10-22 DIAGNOSIS — I951 Orthostatic hypotension: Secondary | ICD-10-CM | POA: Diagnosis not present

## 2023-10-22 DIAGNOSIS — J439 Emphysema, unspecified: Secondary | ICD-10-CM | POA: Diagnosis not present

## 2023-10-22 DIAGNOSIS — E785 Hyperlipidemia, unspecified: Secondary | ICD-10-CM | POA: Diagnosis not present

## 2023-10-22 DIAGNOSIS — M810 Age-related osteoporosis without current pathological fracture: Secondary | ICD-10-CM | POA: Diagnosis not present

## 2023-10-22 DIAGNOSIS — I700 Atherosclerosis of aorta: Secondary | ICD-10-CM | POA: Diagnosis not present

## 2023-10-22 DIAGNOSIS — J4520 Mild intermittent asthma, uncomplicated: Secondary | ICD-10-CM | POA: Diagnosis not present

## 2023-10-22 DIAGNOSIS — K219 Gastro-esophageal reflux disease without esophagitis: Secondary | ICD-10-CM | POA: Diagnosis not present

## 2023-11-25 NOTE — Progress Notes (Signed)
 Patient ID: Mckenzie Collins, female   DOB: 1941-01-30     MRN: [id number]    Brief patient profile:  79 yowf never smoker asthma as child but 5th grade had  tonsilectomy  then no symptoms at all but around 2008 noted sob p exp to cat assoc with throat fullness, eyes  water  itch completely goes away with avoidance then new doe x fall 2017 variable intensity but ? Better with cooler weather so referred to pulmonary clinic 10/12/2017 by Dr   Sheryle with sob ? Asthma.    History of Present Illness  10/12/2017 1st [facility] office visit/ Mckenzie Collins   Chief Complaint  Patient presents with   Pulmonary Consult    Referred by Dr. Sheryle. Pt states having SOB for the past year, worse since Summer 2018. She gets winded sometimes when she walks quickly for a short distance or up stairs. She has occ cough that is non prod.   sev years prior to OV  Developed cp > dx as GERD and uses otc's prn heartburn but since then has intermittent sense of throat irritation/ urge to cough  attributes   to reflux but not that bad No noct symptoms  Doe on best days = MMRC1 = can walk nl pace, flat grade, can't hurry or go uphills or steps s sob  But some days can't get across the room s same sensation s assoc cp n or v or diaphoresis  rec Pantoprazole  (protonix ) 40 mg   Take  30-60 min before first meal of the day and Pepcid  (famotidine )  20 mg one @  bedtime until return to office - this is the best way to tell whether stomach acid is contributing to your problem.   GERD diet  If breathing bothering you ok to use up to 2 puffs of symbicort  80 every 12 hours as needed Work on inhaler technique:       01/08/2018  F/u extended  ov/Mckenzie Collins re: new onset chest pressure (chart review indicates first w/u by cards in 2011) Chief Complaint  Patient presents with   Follow-up    Breathing is unchanged. She states that she has been having some chest and abd pain off and on for the past 3 days- feel very full worse  after she eats.   new chest pressure 6 months comes and goes and aware of it  24/7 when thinks about it  and feels worse with eating anything for an hour/ worse since stopped gerd rx/ generalized anterior cp / epigastric discomfort  s rad or assoc nausea/ diaph Dyspnea:  Improved but not exercising - one time went to top of steps and sob still same but no cp worsening  Cough: none  Sleep: disturbed by chest discomfort which did not respond to tums on the noct prior to OV  and lasted for sev hours  SABA use:  None  rec Resume Pantoprazole  (protonix ) 40 mg   Take  30-60 min before first meal of the day and Pepcid  (famotidine )  20 mg one @  bedtime until return to office - this is the best way to tell whether stomach acid is contributing to your problem.    03/05/2018  f/u ov/Mckenzie Collins re: asthma/ gerd better on gerd rx  symbicort  80 1-2 in am only  Chief Complaint  Patient presents with   Follow-up    Chest discomfort she had been having has resolved. No new co's.   Dyspnea:  Not limited by  breathing from desired activities/ no further chest tightness    Cough: none Sleep: ok now  SABA use:  None Rec Change symbicort  80 to where you take it up to 2 puffs every 12 hours  Work on maintaining perfect inhaler technique:     CT coronary 11/24/21 mild CAD   11/27/2023  Re-establish  ov/ office/Mckenzie Collins re: asthma/gerd maint on symbicort  80 2 in am  and none in pm  Chief Complaint  Patient presents with   Establish Care    LOV 02/2018   Asthma  Dyspnea:  walking one half hour around neighborhood @ in 7 am just p symbicort  rx but hfa very poor / sometimes gets chest pressure at end of walk but it's not the hardest part  Cough: none  Sleeping: < 30 degrees HOB s  resp cc or cp SABA use: helps chest tightness  some  02: none    No obvious day to day or daytime variability or assoc excess/ purulent sputum or mucus plugs or hemoptysis or   wheeze or overt sinus or hb symptoms.    Also  denies any obvious fluctuation of symptoms with weather or environmental changes or other aggravating or alleviating factors except as outlined above   No unusual exposure hx or h/o childhood pna  or knowledge of premature birth.  Current Allergies, Complete Past Medical History, Past Surgical History, Family History, and Social History were reviewed in [employer] record.  ROS  The following are not active complaints unless bolded Hoarseness, sore throat, dysphagia, dental problems, itching, sneezing,  nasal congestion or discharge of excess mucus or purulent secretions, ear ache,   fever, chills, sweats, unintended wt loss or wt gain, classically pleuritic cp,  orthopnea pnd or arm/hand swelling  or leg swelling, presyncope, palpitations, abdominal pain, anorexia, nausea, vomiting, diarrhea  or change in bowel habits or change in bladder habits, change in stools or change in urine, dysuria, hematuria,  rash, arthralgias, visual complaints, headache, numbness, weakness or ataxia or problems with walking or coordination,  change in mood or  memory.        Current Meds  Medication Sig   acetaminophen  (TYLENOL ) 325 MG tablet Take 650 mg by mouth every 6 (six) hours as needed.   albuterol  (VENTOLIN  HFA) 108 (90 Base) MCG/ACT inhaler Inhale 2 puffs into the lungs every 4 (four) hours as needed for wheezing or shortness of breath.   aluminum hydroxide-magnesium carbonate (GAVISCON) 95-358 MG/15ML SUSP Take by mouth.   budesonide -formoterol  (SYMBICORT ) 80-4.5 MCG/ACT inhaler Inhale 2 puffs into the lungs 2 (two) times daily. (Patient taking differently: Inhale 1-2 puffs into the lungs every morning.)   Calcium  Carb-Cholecalciferol (CALTRATE 600+D3 PO) Take 1 tablet by mouth in the morning and at bedtime.   calcium  elemental as carbonate (TUMS ULTRA 1000) 400 MG chewable tablet Chew 1,000 mg by mouth daily as needed for heartburn.   cholecalciferol (VITAMIN D3) 25 MCG (1000 UNIT)  tablet Take 1,000 Units by mouth daily.   cycloSPORINE (RESTASIS) 0.05 % ophthalmic emulsion 1 drop 2 (two) times daily.   ibuprofen (ADVIL) 200 MG tablet Take 200 mg by mouth every 6 (six) hours as needed.   loratadine (CLARITIN) 10 MG tablet Take 10 mg by mouth daily.   Multiple Vitamins-Minerals (CENTRUM SILVER  ULTRA WOMENS PO) Take 1 tablet by mouth daily.   pantoprazole  (PROTONIX ) 40 MG tablet Take 40 mg by mouth every morning.   Propylene Glycol (SYSTANE BALANCE) 0.6 % SOLN Place 1 drop  into both eyes 2 (two) times daily as needed (dry eyes).                Objective:   Physical Exam      amb wf nad   11/27/2023        138  03/05/2018        139  01/08/2018        138  11/26/2017        139  10/12/17 138 lb (62.6 kg)  01/23/17 140 lb (63.5 kg)  01/03/17 141 lb (64 kg)     Vital signs reviewed  11/27/2023  - Note at rest 02 sats  96% on RA   General appearance:    pleasant amb wf nad    HEENT : Oropharynx  clear      Nasal turbinates nl / no polyps   NECK :  without  apparent JVD/ palpable Nodes/TM    LUNGS: no acc muscle use,  Nl contour chest which is clear to A and P bilaterally without cough on insp or exp maneuvers   CV:  RRR  no s3 or murmur or increase in P2, and no edema   ABD:  soft and nontender   MS:  Gait nl   ext warm without deformities Or obvious joint restrictions  calf tenderness, cyanosis or clubbing    SKIN: warm and dry without lesions    NEURO:  alert, approp, nl sensorium with  no motor or cerebellar deficits apparent.       Assessment:

## 2023-11-27 ENCOUNTER — Ambulatory Visit: Payer: Medicare Other | Admitting: Internal Medicine

## 2023-11-27 ENCOUNTER — Encounter: Payer: Self-pay | Admitting: Internal Medicine

## 2023-11-27 VITALS — BP 115/69 | HR 104 | Ht 63.0 in | Wt 138.0 lb

## 2023-11-27 DIAGNOSIS — J4540 Moderate persistent asthma, uncomplicated: Secondary | ICD-10-CM | POA: Diagnosis not present

## 2023-11-27 MED ORDER — ALBUTEROL SULFATE HFA 108 (90 BASE) MCG/ACT IN AERS: 2.0000 | INHALATION_SPRAY | Freq: Four times a day (QID) | RESPIRATORY_TRACT | 3 refills | Status: AC | PRN

## 2023-11-27 NOTE — Assessment & Plan Note (Signed)
 Never smoker  - PFT's  09/14/2017  FEV1 1.36 (66 % ) ratio 56    with DLCO  67/67c % corrects to 85 % for alv volume  And classic curvature  - Allergy  profile 10/12/2017 >  Eos 0.3 /  IgE  223 with RAST pos dust, cat> dog, grass, ragweed - FENO 10/12/2017  =   39  - 10/12/2017    try symb 80 2bid - 11/26/2017  try symbicort  80  1 each am other doses prn as pt very anxious re side effects  - Spirometry 01/08/2018  FEV1 1.46 (68%)  Ratio 57 p am symb x1 though hfa very poor  - FENO 01/08/2018  =   32 on symb 80 one bid   - 03/05/2018  After extensive coaching inhaler device  effectiveness =    75%  - 11/27/2023  After extensive coaching inhaler device,  effectiveness =    75% from a baseline  of 25 % > increase symbicort  80 Take 2 puffs first thing in am and then another 2 puffs about 12 hours later.   Chest tightness was first reported 2011 with neg cards w/u and repeated in 2013 with only mild CAD so most likely this is related to bronchospasm not controlled with symbicort  with inadequate technique while on gerd rx so rec   1) master hfa 2) symbicort  80 2bid  3) approp saba; Re SABA :  I spent extra time with pt today reviewing appropriate use of albuterol  for prn use on exertion with the following points: 1) saba is for relief of sob/chest tightness  that does not improve by walking a slower pace or resting but rather if the pt does not improve after trying this first. 2) If the pt is convinced, as many are, that saba helps recover from activity faster then it's easy to tell if this is the case by re-challenging : ie stop, take the inhaler, then p 5 minutes try the exact same activity (intensity of workload) that just caused the symptoms and see if they are substantially diminished or not after saba 3) if there is an activity that reproducibly causes the symptoms, try the saba 15 min before the activity on alternate days   If in fact the saba really does help, then fine to continue to use it prn but  advised may need to look closer at the maintenance regimen being used to achieve better control of airways disease with exertion.   F/u in 6 weeks with  all resp  meds in hand using a trust but verify approach to confirm accurate Medication  Reconciliation The principal here is that until we are certain that the  patients are doing what we've asked, it makes no sense to ask them to do more.          Each maintenance medication was reviewed in detail including emphasizing most importantly the difference between maintenance and prns and under what circumstances the prns are to be triggered using an action plan format where appropriate.  Total time for H and P, chart review, counseling, reviewing hfa device(s) and generating customized AVS unique to this office visit / same day charting = 45 min with pt not seen in  > 3 y

## 2023-11-27 NOTE — Patient Instructions (Signed)
 Plan A = Automatic = Always=    Symbicort  80 Take 2 puffs first thing in am and then another 2 puffs about 12 hours later.    Work on inhaler technique:  relax and gently blow all the way out then take a nice smooth full deep breath back in, triggering the inhaler at same time you start breathing in.  Hold breath in for at least  5 seconds if you can. Blow out symbicort   thru nose. Rinse and gargle with water  when done.  If mouth or throat bother you at all,  try brushing teeth/gums/tongue with arm and hammer toothpaste/ make a slurry and gargle and spit out.  >>>  Remember how golfers warm up by taking practice swings - do this with an empty inhaler    Plan B = Backup (to supplement plan A, not to replace it) Only use your albuterol  inhaler as a rescue medication to be used if you can't catch your breath by resting or doing a relaxed purse lip breathing pattern.  - The less you use it, the better it will work when you need it. - Ok to use the inhaler up to 2 puffs  every 4 hours if you must but call for appointment if use goes up over your usual need - Don't leave home without it !!  (think of it like the spare tire for your car)   Please schedule a follow up office visit in 6 weeks, call sooner if needed

## 2023-12-03 ENCOUNTER — Telehealth (INDEPENDENT_AMBULATORY_CARE_PROVIDER_SITE_OTHER): Payer: Self-pay | Admitting: Otolaryngology

## 2023-12-03 NOTE — Telephone Encounter (Signed)
confirmed appt & location [phone] afm

## 2023-12-04 ENCOUNTER — Ambulatory Visit (INDEPENDENT_AMBULATORY_CARE_PROVIDER_SITE_OTHER): Payer: Medicare Other

## 2023-12-04 VITALS — BP 144/75 | HR 94 | Ht 63.0 in | Wt 138.0 lb

## 2023-12-04 DIAGNOSIS — H6123 Impacted cerumen, bilateral: Secondary | ICD-10-CM

## 2023-12-04 DIAGNOSIS — H903 Sensorineural hearing loss, bilateral: Secondary | ICD-10-CM | POA: Diagnosis not present

## 2023-12-04 NOTE — Progress Notes (Unsigned)
Patient ID: Mckenzie Collins, female   DOB: Sep 22, 1941, 83 y.o.   MRN: [id number]  CC: Hearing difficulty  HPI:  Mckenzie Collins is a 83 y.o. female who presents today complaining of hearing loss.  She has been having increasing hearing difficulty understanding other people, especially in noisy environments.  She also complains of ear sensitivity and discomfort when exposed to loud noises.  She has no recent otitis media or otitis externa.  She has no previous otologic surgery.  She underwent adenotonsillectomy surgery as a child.  She has never worn hearing aids.  Her previous audiogram from 2016 showed bilateral symmetric high-frequency sensorineural hearing loss, likely secondary to routine presbycusis.  Past Medical History:  Diagnosis Date   Anxiety    Asthma    uses symbicort, uses albuterol maybe once a month   Breast mass left   skin lesion around 11-12 x 2 -3 weeks   Chest pain    history of   Dyspnea 2005   Negative stress nuclear in 2005; normal echocardiogram   Family history of breast cancer 05/05/2022   Family history of pancreatic cancer 05/05/2022   Gastroesophageal reflux disease    hiatal hernia; gastritis; dysphagia   Hemorrhoids    Inverted nipple left   always   Low back pain    Microscopic hematuria    Osteoporosis    Palpitations 2003   few short runs of supraventricular tachycardia on Holter   Spondylolisthesis of cervical region     Past Surgical History:  Procedure Laterality Date   CATARACT EXTRACTION Bilateral    COLONOSCOPY  2011   COLONOSCOPY WITH PROPOFOL N/A 12/08/2020   Procedure: COLONOSCOPY WITH PROPOFOL;  Surgeon: Malissa Hippo, MD;  Location: [facility];  Service: Endoscopy;  Laterality: N/A;  8:30   ROBOTIC ASSISTED BILATERAL SALPINGO OOPHERECTOMY Bilateral 04/05/2022   Procedure: Xi robotic assisted salpingo oophorectomy, vaginal biopsy;  Surgeon: Carver Fila, MD;  Location: [facility];  Service: Gynecology;  Laterality: Bilateral;    TONSILLECTOMY      Family History  Problem Relation Age of Onset   Pancreatic cancer Father        dx early 38s   Alzheimer's disease Father    Alzheimer's disease Sister    Congenital heart disease Brother    Breast cancer Paternal Aunt        dx 71s   Alzheimer's disease Paternal Grandfather     Social History:  reports that she has never smoked. She has never used smokeless tobacco. She reports current alcohol use. She reports that she does not use drugs.  Allergies: No Known Allergies  Prior to Admission medications   Medication Sig Start Date End Date Taking? Authorizing Provider  acetaminophen (TYLENOL) 325 MG tablet Take 650 mg by mouth every 6 (six) hours as needed.   Yes [provider]  albuterol (VENTOLIN HFA) 108 (90 Base) MCG/ACT inhaler Inhale 2 puffs into the lungs every 6 (six) hours as needed for wheezing or shortness of breath. 11/27/23  Yes Nyoka Cowden, MD  aluminum hydroxide-magnesium carbonate (GAVISCON) 95-358 MG/15ML SUSP Take by mouth.   Yes [provider]  budesonide-formoterol (SYMBICORT) 80-4.5 MCG/ACT inhaler Inhale 2 puffs into the lungs 2 (two) times daily. Patient taking differently: Inhale 1-2 puffs into the lungs every morning. 01/08/18  Yes Nyoka Cowden, MD  Calcium Carb-Cholecalciferol (CALTRATE 600+D3 PO) Take 1 tablet by mouth in the morning and at bedtime.   Yes [provider]  calcium elemental as carbonate (TUMS ULTRA 1000) 400 MG chewable tablet Chew 1,000 mg by mouth daily as needed for heartburn.   Yes [provider]  cholecalciferol (VITAMIN D3) 25 MCG (1000 UNIT) tablet Take 1,000 Units by mouth daily.   Yes [provider]  cycloSPORINE (RESTASIS) 0.05 % ophthalmic emulsion 1 drop 2 (two) times daily.   Yes [provider]  ibuprofen (ADVIL) 200 MG tablet Take 200 mg by mouth every 6 (six) hours as needed.   Yes [provider]  loratadine (CLARITIN) 10 MG tablet Take  10 mg by mouth daily.   Yes [provider]  Multiple Vitamins-Minerals (CENTRUM SILVER ULTRA WOMENS PO) Take 1 tablet by mouth daily.   Yes [provider]  pantoprazole (PROTONIX) 40 MG tablet Take 40 mg by mouth every morning. 11/21/23  Yes [provider]  Propylene Glycol (SYSTANE BALANCE) 0.6 % SOLN Place 1 drop into both eyes 2 (two) times daily as needed (dry eyes).   Yes [provider]    Blood pressure (!) 144/75, pulse 94, height 5\' 3"  (1.6 m), weight 138 lb (62.6 kg), SpO2 95%. Exam: General: Communicates without difficulty, well nourished, no acute distress. Head: Normocephalic, no evidence injury, no tenderness, facial buttresses intact without stepoff. Face/sinus: No tenderness to palpation and percussion. Facial movement is normal and symmetric. Eyes: PERRL, EOMI. No scleral icterus, conjunctivae clear. Neuro: CN II exam reveals vision grossly intact.  No nystagmus at any point of gaze. Ears: Auricles well formed without lesions.  Bilateral cerumen impaction.  Nose: External evaluation reveals normal support and skin without lesions.  Dorsum is intact.  Anterior rhinoscopy reveals congested mucosa over anterior aspect of inferior turbinates and intact septum.  No purulence noted. Oral:  Oral cavity and oropharynx are intact, symmetric, without erythema or edema.  Mucosa is moist without lesions. Neck: Full range of motion without pain.  There is no significant lymphadenopathy.  No masses palpable.  Thyroid bed within normal limits to palpation.  Parotid glands and submandibular glands equal bilaterally without mass.  Trachea is midline. Neuro:  CN 2-12 grossly intact.   Procedure: Bilateral cerumen disimpaction Anesthesia: None Description: Under the operating microscope, the cerumen is carefully removed with a combination of cerumen currette, alligator forceps, and suction catheters.  After the cerumen is removed, the TMs are noted to be normal.  No  mass, erythema, or lesions. The patient tolerated the procedure well.    Assessment: 1.  Bilateral cerumen impaction.  After the cerumen disimpaction procedure, both tympanic membranes and middle ear spaces are noted to be normal.  No middle ear effusion or infection is noted. 2.  Bilateral high-frequency sensorineural hearing loss.  Her hearing test in 2016 showed symmetric bilateral high-frequency sensorineural hearing loss.  Plan: 1.  Otomicroscopy with bilateral cerumen disimpaction. 2.  The physical exam findings are reviewed with the patient. 3.  Repeat audiometric evaluation with our audiologist as an outpatient.  The audiologist is not available today. 4.  The patient is a candidate for hearing amplification. 5.  The patient will return for reevaluation in 1 year, sooner if needed.   Tabias Swayze W Bralyn Espino 12/04/2023, 2:36 PM

## 2023-12-28 ENCOUNTER — Ambulatory Visit (INDEPENDENT_AMBULATORY_CARE_PROVIDER_SITE_OTHER): Payer: Medicare Other | Admitting: Audiology

## 2024-01-01 ENCOUNTER — Ambulatory Visit (INDEPENDENT_AMBULATORY_CARE_PROVIDER_SITE_OTHER): Payer: Medicare Other | Admitting: Audiology

## 2024-01-07 NOTE — Progress Notes (Unsigned)
 Patient ID: JESSLY LEBECK, female   DOB: 07/16/1941     MRN: [id number]    Brief patient profile:  40 yowf never smoker asthma as child but 5th grade had  tonsilectomy  then no symptoms at all but around 2008 noted sob p exp to cat assoc with throat fullness, eyes  water itch completely goes away with avoidance then new doe x fall 2017 variable intensity but ? Better with cooler weather so referred to pulmonary clinic 10/12/2017 by Dr   Ouida Sills with sob ? Asthma.    History of Present Illness  10/12/2017 1st [facility] office visit/ Mariyanna Mucha   Chief Complaint  Patient presents with   Pulmonary Consult    Referred by Dr. Ouida Sills. Pt states having SOB for the past year, worse since Summer 2018. She gets winded sometimes when she walks quickly for a short distance or up stairs. She has occ cough that is non prod.   sev years prior to OV  Developed cp > dx as GERD and uses otc's prn heartburn but since then has intermittent sense of throat irritation/ urge to cough  attributes   to reflux but "not that bad" No noct symptoms  Doe on best days = MMRC1 = can walk nl pace, flat grade, can't hurry or go uphills or steps s sob  But some days can't get across the room s same sensation s assoc cp n or v or diaphoresis  rec Pantoprazole (protonix) 40 mg   Take  30-60 min before first meal of the day and Pepcid (famotidine)  20 mg one @  bedtime until return to office - this is the best way to tell whether stomach acid is contributing to your problem.   GERD diet  If breathing bothering you ok to use up to 2 puffs of symbicort 80 every 12 hours as needed Work on inhaler technique:       01/08/2018  F/u extended  ov/Karie Skowron re: "new onset" chest pressure (chart review indicates first w/u by cards in 2011) Chief Complaint  Patient presents with   Follow-up    Breathing is unchanged. She states that she has been having some chest and abd pain off and on for the past 3 days- "feel very full" worse  after she eats.   new chest pressure 6 months comes and goes and aware of it  24/7 when thinks about it  and feels worse with eating anything for an hour/ worse since stopped gerd rx/ generalized anterior cp / epigastric discomfort  s rad or assoc nausea/ diaph Dyspnea:  Improved but not exercising - one time went to top of steps and sob still same but no cp worsening  Cough: none  Sleep: disturbed by chest discomfort which did not respond to tums on the noct prior to OV  and lasted for sev hours  SABA use:  None  rec Resume Pantoprazole (protonix) 40 mg   Take  30-60 min before first meal of the day and Pepcid (famotidine)  20 mg one @  bedtime until return to office - this is the best way to tell whether stomach acid is contributing to your problem.    03/05/2018  f/u ov/Canio Winokur re: asthma/ gerd better on gerd rx  symbicort 80 1-2 in am only  Chief Complaint  Patient presents with   Follow-up    Chest discomfort she had been having has resolved. No new co's.   Dyspnea:  Not limited by  breathing from desired activities/ no further chest tightness    Cough: none Sleep: ok now  SABA use:  None Rec Change symbicort 80 to where you take it up to 2 puffs every 12 hours  Work on maintaining perfect inhaler technique:     CT coronary 11/24/21 mild CAD   11/27/2023  Re-establish  ov/[place] office/Murdis Flitton re: asthma/gerd maint on symbicort 80 2 in am  and none in pm  Chief Complaint  Patient presents with   Establish Care    LOV 02/2018   Asthma  Dyspnea:  walking one half hour around neighborhood @ in 7 am just p symbicort rx but hfa very poor / sometimes gets chest pressure at end of walk but it's not the hardest part  Cough: none  Sleeping: < 30 degrees HOB s  resp cc or cp SABA use: helps chest tightness  "some"  02: none  Rec Plan A = Automatic = Always=    Symbicort 80 Take 2 puffs first thing in am and then another 2 puffs about 12 hours later.  Work on inhaler technique:   Plan B =  Backup (to supplement plan A, not to replace it) Only use your albuterol inhaler as a rescue medication   01/09/2024  f/u ov/[place] office/Arali Somera re: asthma maint on symbicort 80 2bid   Chief Complaint  Patient presents with   Follow-up   Dyspnea:  walking half an hour sev times a week  one episode of chest tightness  Cough: none but throat is dry assoc with nasal congestion  Sleeping: 30 degrees s  resp cc  SABA use: rarely  02: none      No obvious day to day or daytime variability or assoc excess/ purulent sputum or mucus plugs or hemoptysis or cp or chest tightness, subjective wheeze or overt sinus or hb symptoms.    Also denies any obvious fluctuation of symptoms with weather or environmental changes or other aggravating or alleviating factors except as outlined above   No unusual exposure hx or h/o childhood pna or knowledge of premature birth.  Current Allergies, Complete Past Medical History, Past Surgical History, Family History, and Social History were reviewed in [employer] record.  ROS  The following are not active complaints unless bolded Hoarseness, sore throat, dysphagia, dental problems, itching, sneezing,  nasal congestion or discharge of excess mucus or purulent secretions, ear ache,   fever, chills, sweats, unintended wt loss or wt gain, classically pleuritic or exertional cp,  orthopnea pnd or arm/hand swelling  or leg swelling, presyncope, palpitations, abdominal pain, anorexia, nausea, vomiting, diarrhea  or change in bowel habits or change in bladder habits, change in stools or change in urine, dysuria, hematuria,  rash, arthralgias, visual complaints, headache, numbness, weakness or ataxia or problems with walking or coordination,  change in mood or  memory.        Current Meds  Medication Sig   acetaminophen (TYLENOL) 325 MG tablet Take 650 mg by mouth every 6 (six) hours as needed.   albuterol (VENTOLIN HFA) 108 (90 Base) MCG/ACT  inhaler Inhale 2 puffs into the lungs every 6 (six) hours as needed for wheezing or shortness of breath.   aluminum hydroxide-magnesium carbonate (GAVISCON) 95-358 MG/15ML SUSP Take by mouth.   budesonide-formoterol (SYMBICORT) 80-4.5 MCG/ACT inhaler Inhale 2 puffs into the lungs 2 (two) times daily. (Patient taking differently: Inhale 1-2 puffs into the lungs every morning.)   Calcium Carb-Cholecalciferol (CALTRATE 600+D3 PO) Take 1 tablet by  mouth in the morning and at bedtime.   calcium elemental as carbonate (TUMS ULTRA 1000) 400 MG chewable tablet Chew 1,000 mg by mouth daily as needed for heartburn.   cholecalciferol (VITAMIN D3) 25 MCG (1000 UNIT) tablet Take 1,000 Units by mouth daily.   cycloSPORINE (RESTASIS) 0.05 % ophthalmic emulsion 1 drop 2 (two) times daily.   ibuprofen (ADVIL) 200 MG tablet Take 200 mg by mouth every 6 (six) hours as needed.   loratadine (CLARITIN) 10 MG tablet Take 10 mg by mouth daily.   Multiple Vitamins-Minerals (CENTRUM SILVER ULTRA WOMENS PO) Take 1 tablet by mouth daily.   pantoprazole (PROTONIX) 40 MG tablet Take 40 mg by mouth every morning.   Propylene Glycol (SYSTANE BALANCE) 0.6 % SOLN Place 1 drop into both eyes 2 (two) times daily as needed (dry eyes).                    Objective:   Physical Exam      amb wf nad   11/27/2023        138  03/05/2018        139  01/08/2018        138  11/26/2017        139  10/12/17 138 lb (62.6 kg)  01/23/17 140 lb (63.5 kg)  01/03/17 141 lb (64 kg)    Vital signs reviewed  01/09/2024  - Note at rest 02 sats  94% on DRA   General appearance:    amb somber wf nad    HEENT : Oropharynx  nl      Nasal turbinates mod edema/ non-specific    NECK :  without  apparent JVD/ palpable Nodes/TM    LUNGS: no acc muscle use,  Nl contour chest which is clear to A and P bilaterally without cough on insp or exp maneuvers   CV:  RRR  no s3 or murmur or increase in P2, and no edema   ABD:  soft and nontender    MS:  Gait nl   ext warm without deformities Or obvious joint restrictions  calf tenderness, cyanosis or clubbing    SKIN: warm and dry without lesions    NEURO:  alert, approp, nl sensorium with  no motor or cerebellar deficits apparent.         Assessment:

## 2024-01-09 ENCOUNTER — Encounter: Payer: Self-pay | Admitting: Internal Medicine

## 2024-01-09 ENCOUNTER — Ambulatory Visit: Payer: Medicare Other | Admitting: Internal Medicine

## 2024-01-09 VITALS — BP 118/67 | HR 97 | Ht 63.0 in | Wt 139.4 lb

## 2024-01-09 DIAGNOSIS — J4540 Moderate persistent asthma, uncomplicated: Secondary | ICD-10-CM | POA: Diagnosis not present

## 2024-01-09 NOTE — Patient Instructions (Addendum)
 Pepcid 20 mg (over the counter) take  one after supper   Symbicort 80 Take 2 puffs first thing in am and then another 2 puffs about 12 hours later.    Work on inhaler technique:  relax and gently blow all the way out then take a nice smooth full deep breath back in, triggering the inhaler at same time you start breathing in.  Hold breath in for at least  5 seconds if you can. Blow out symbicort  thru nose. Rinse and gargle with water when done.  If mouth or throat bother you at all,  try brushing teeth/gums/tongue with arm and hammer toothpaste/ make a slurry and gargle and spit out.    Also  Ok to try albuterol 15 min before an activity (on alternating days)  that you know would usually make you short of breath and see if it makes any difference and if makes none then don't take albuterol after activity unless you can't catch your breath as this means it's the resting that helps, not the albuterol.  Call if the chest tightness gets worse with exertion  and/ or does  not respond to albuterol as it might be angina   Follow up with Dr Suszanne Conners regarding your nasal symptoms   Pulmonary follow up is as needed

## 2024-01-09 NOTE — Assessment & Plan Note (Addendum)
 Never smoker  - PFT's  09/14/2017  FEV1 1.36 (66 % ) ratio 56    with DLCO  67/67c % corrects to 85 % for alv volume  And classic curvature  - Allergy profile 10/12/2017 >  Eos 0.3 /  IgE  223 with RAST pos dust, cat> dog, grass, ragweed - FENO 10/12/2017  =   39  - 10/12/2017    try symb 80 2bid - 11/26/2017  try symbicort 80  1 each am other doses prn as pt very anxious re side effects  - Spirometry 01/08/2018  FEV1 1.46 (68%)  Ratio 57 p am symb x1 though hfa very poor  - FENO 01/08/2018  =   32 on symb 80 one bid   - 03/05/2018  After extensive coaching inhaler device  effectiveness =    75%  - 11/27/2023  After extensive coaching inhaler device,  effectiveness =    75% from a baseline  of 25 % > increase symbicort 80 Take 2 puffs first thing in am and then another 2 puffs about 12 hours later.  - 01/09/2024  After extensive coaching inhaler device,  effectiveness =    75% (short ti)  - 01/09/2024   Walked on RA  x  3  lap(s) =  approx 450  ft  @ fast pace, stopped due to end of study  with lowest 02 sats 97% s chest tightness or cp      Some of her symptoms are non-specific and could be atypical angina or gerd related but :  All goals of chronic asthma control met including optimal function and elimination of symptoms with minimal need for rescue therapy.  Contingencies discussed in full including contacting this office immediately if not controlling the symptoms using the rule of two's.     F/u with pcp re ? Need for cards eval esp if saba  pre ex or on recchallenging if chest tightness sensation does not improve  F/u in pulmonary is prn   Each maintenance medication was reviewed in detail including emphasizing most importantly the difference between maintenance and prns and under what circumstances the prns are to be triggered using an action plan format where appropriate.  Total time for H and P, chart review, counseling, reviewing hfa  device(s) , directly observing portions of ambulatory  02 saturation study/ and generating customized AVS unique to this office visit / same day charting = 32 min

## 2024-01-19 IMAGING — MR MR PELVIS WO/W CM
13 series · 48 of 48 positions shown · IV contrast (Contrast agent)
Comparison: Ultrasound on 01/26/2022 and CT on 01/17/2022

CLINICAL DATA: Follow-up indeterminate right ovarian lesion.

EXAM:
MRI PELVIS WITHOUT AND WITH CONTRAST
TECHNIQUE: Multiplanar multisequence MR imaging of the pelvis was performed
both before and after administration of intravenous contrast.
CONTRAST:  7mL GADAVIST GADOBUTROL 1 MMOL/ML IV SOLN

[Series 3: T2 · coronal · 5.0mm · 1.41mm/px · 1 of 30 slices shown]
[im 1/30]
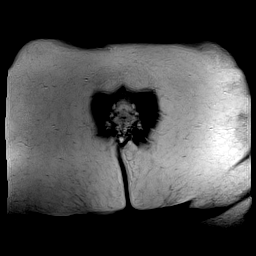

[Series 4: ax tse trig · axial · 5.0mm · 0.75mm/px · z∈[-154,+62]mm · 2 of 37 slices shown]
[im 1/37]
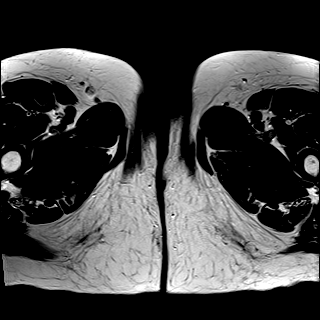
[im 37/37]
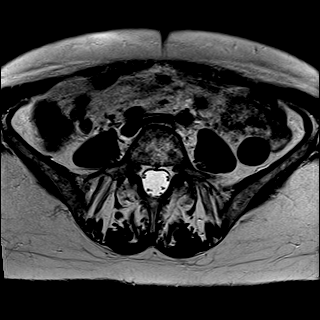

[Series 5: ax tse fs · axial · 5.0mm · 0.75mm/px · z∈[-154,+62]mm · 2 of 37 slices shown]
[im 1/37]
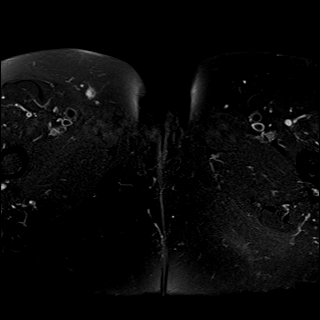
[im 37/37]
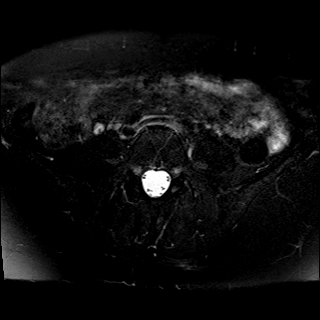

[Series 6: sag tse trig · sagittal · 5.0mm · 0.75mm/px · 2 of 33 slices shown]
[im 1/33]
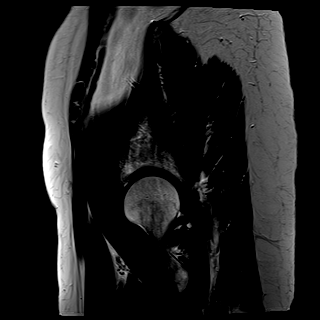
[im 33/33]
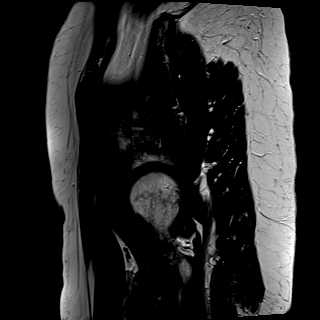

[Series 7: ax in and · axial · 3.0mm · 1.19mm/px · z∈[-168,+69]mm · 5 of 80 slices shown (1 of 2)]
[im 1/80]
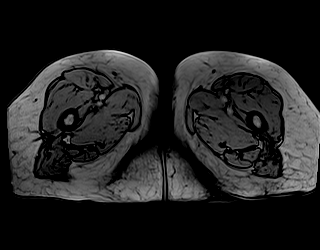
[im 20/80]
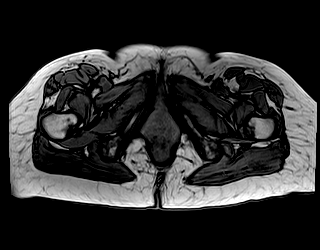
[im 40/80]
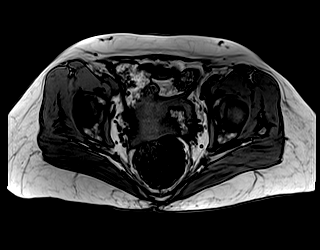
[im 60/80]
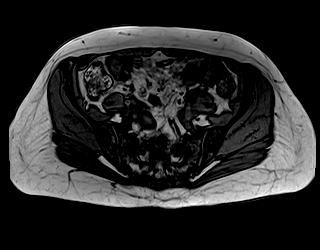
[im 80/80]
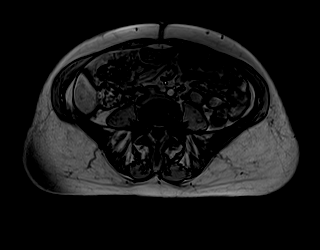

[Series 8: ax in and · axial · 3.0mm · 1.19mm/px · z∈[-168,+69]mm · 5 of 80 slices shown (2 of 2)]
[im 1/80]
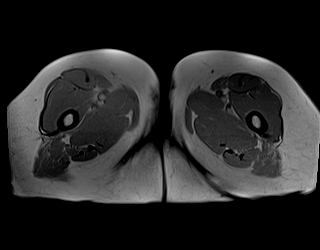
[im 20/80]
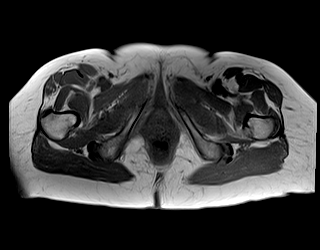
[im 40/80]
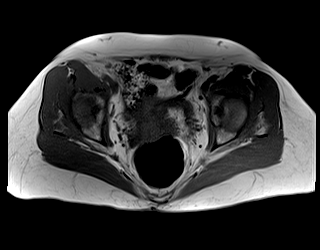
[im 60/80]
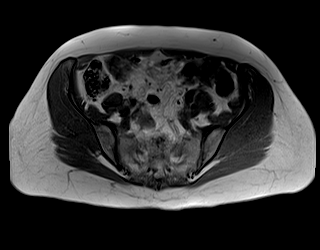
[im 80/80]
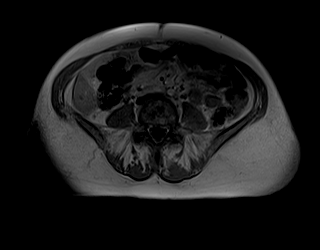

[Series 9: T1 dynamic · axial · non-contrast · 3.0mm · 1.19mm/px · z∈[-180,+81]mm · 5 of 88 slices shown]
[im 1/88]
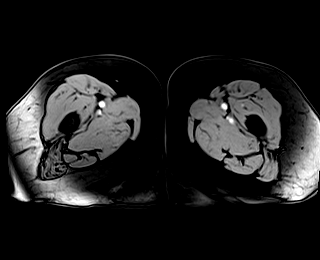
[im 22/88]
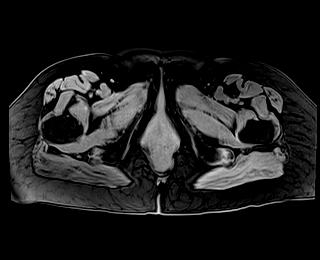
[im 44/88]
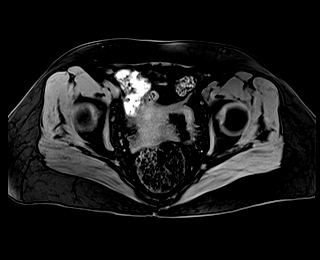
[im 66/88]
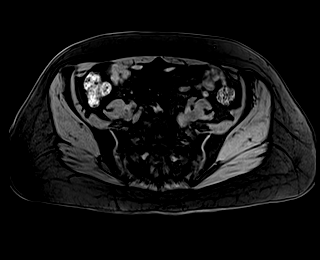
[im 88/88]
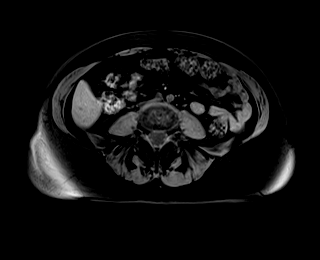

[Series 10: T1 dynamic post-contrast · axial · 3.0mm · 1.19mm/px · z∈[-180,+81]mm · 5 of 88 slices shown (1 of 5)]
[im 1/88]
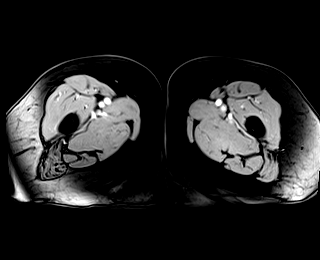
[im 22/88]
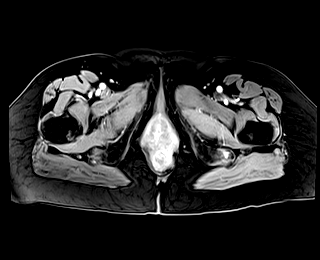
[im 44/88]
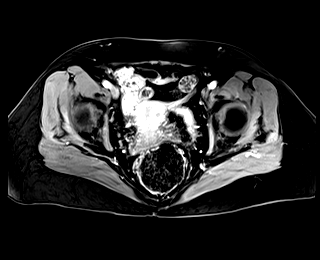
[im 66/88]
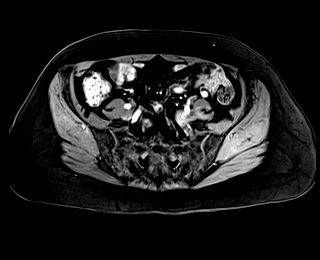
[im 88/88]
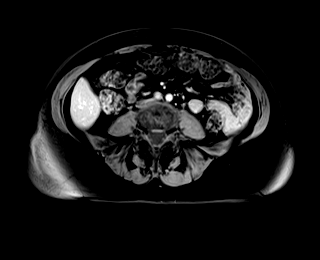

[Series 11: T1 dynamic post-contrast · axial · 3.0mm · 1.19mm/px · z∈[-180,+81]mm · 5 of 88 slices shown (2 of 5)]
[im 1/88]
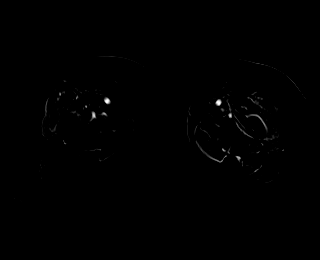
[im 22/88]
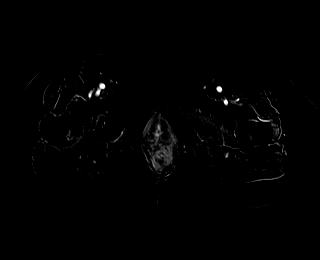
[im 44/88]
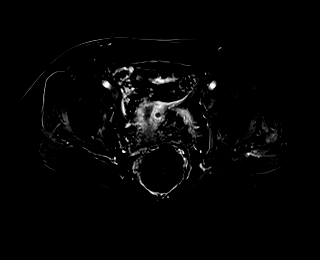
[im 66/88]
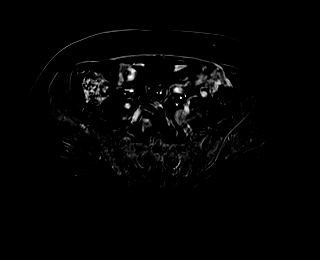
[im 88/88]
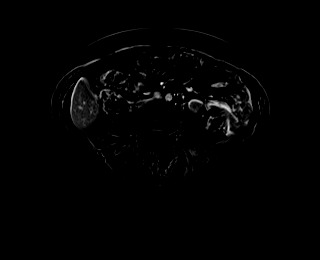

[Series 12: T1 dynamic post-contrast · axial · 3.0mm · 1.19mm/px · z∈[-180,+81]mm · 5 of 88 slices shown (3 of 5)]
[im 1/88]
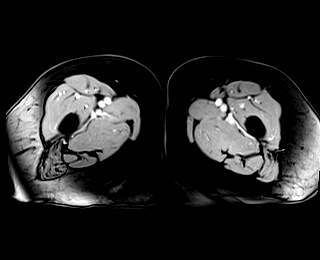
[im 22/88]
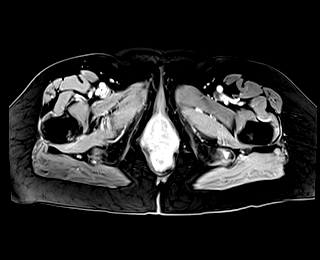
[im 44/88]
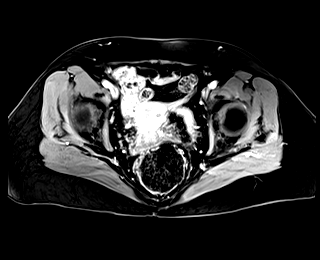
[im 66/88]
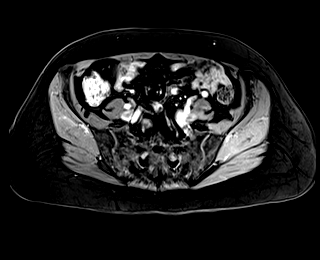
[im 88/88]
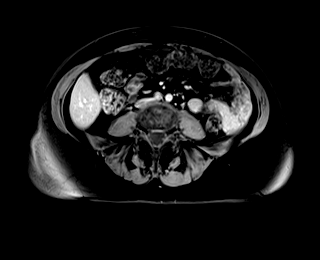

[Series 13: T1 dynamic post-contrast · axial · 3.0mm · 1.19mm/px · z∈[-180,+81]mm · 5 of 88 slices shown (4 of 5)]
[im 1/88]
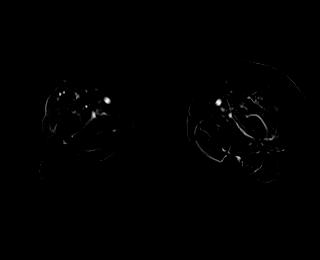
[im 22/88]
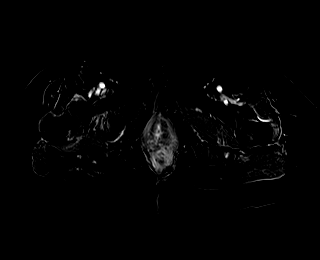
[im 44/88]
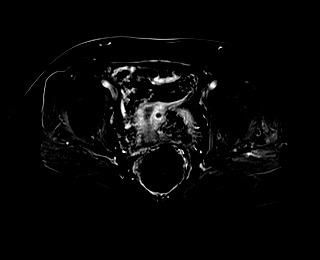
[im 66/88]
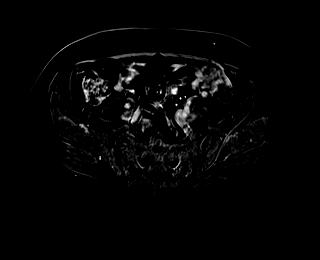
[im 88/88]
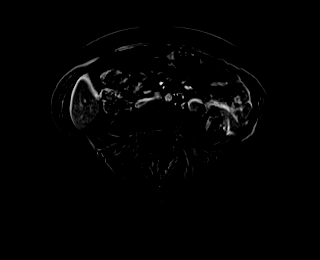

[Series 14: T1 dynamic post-contrast · coronal · 3.0mm · 1.19mm/px · 4 of 64 slices shown (5 of 5)]
[im 1/64]
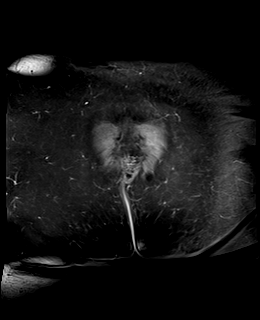
[im 22/64]
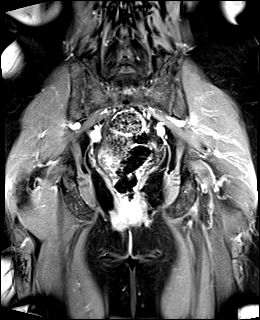
[im 43/64]
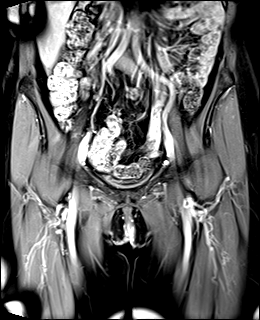
[im 64/64]
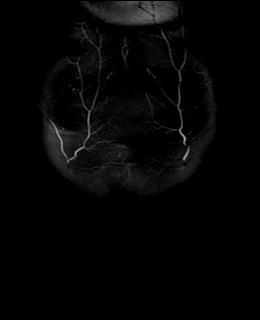

[Series 15: T2 post-contrast · sagittal · 5.0mm · 0.75mm/px · 2 of 33 slices shown]
[im 1/33]
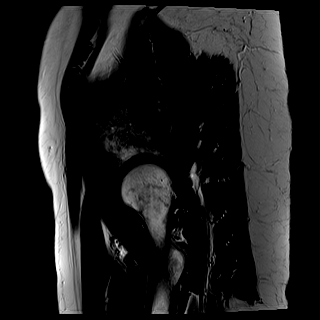
[im 33/33]
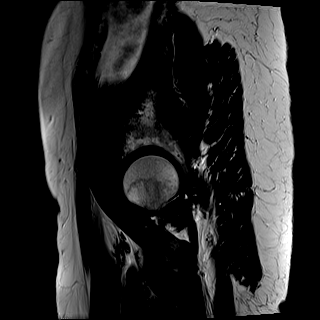

[48 of 48 positions shown; findings below may reference images not displayed]

FINDINGS: Lower Urinary Tract: No urinary bladder or urethral abnormality
identified.

Bowel: Unremarkable pelvic bowel loops.

Vascular/Lymphatic: Unremarkable. No pathologically enlarged pelvic
lymph nodes identified.

Reproductive:

-- Uterus: Measures 5.8 x 2.9 x 3.8 cm (volume = 33 cm^3). A tiny
less than 1 cm intramural fibroid is seen in the left anterior
uterine corpus. No other fibroids identified. Cervix and vagina are
unremarkable.

-- Right ovary: A complex cystic mass is seen which has numerous
thin internal septations and mild soft tissue thickening along the
right lateral wall measuring 5 mm. This measures 3.4 by 2.6 x
cm, and is highly suspicious for a cystic ovarian neoplasm.

-- Left ovary: Appears normal. No ovarian or adnexal masses
identified.

Other: Tiny amount of free fluid is also noted in the right adnexa
and pelvic cul-de-sac.

Musculoskeletal:  Unremarkable.
IMPRESSION: 3.4 cm complex cystic mass in the right adnexa, highly suspicious
for cystic ovarian neoplasm. Surgical evaluation should be
considered.

Tiny amount of free pelvic fluid.

Tiny less than 1 cm uterine fibroid.

## 2024-01-28 ENCOUNTER — Other Ambulatory Visit: Payer: Self-pay | Admitting: Internal Medicine

## 2024-01-28 DIAGNOSIS — Z1231 Encounter for screening mammogram for malignant neoplasm of breast: Secondary | ICD-10-CM

## 2024-02-26 ENCOUNTER — Ambulatory Visit: Payer: Medicare Other

## 2024-02-27 ENCOUNTER — Ambulatory Visit
Admission: RE | Admit: 2024-02-27 | Discharge: 2024-02-27 | Disposition: A | Discharge status: Home / Self Care | Source: Ambulatory Visit | Attending: Internal Medicine | Admitting: Internal Medicine

## 2024-02-27 DIAGNOSIS — Z1231 Encounter for screening mammogram for malignant neoplasm of breast: Secondary | ICD-10-CM

## 2024-03-26 ENCOUNTER — Encounter: Attending: Internal Medicine | Admitting: *Deleted

## 2024-03-26 VITALS — BP 124/74 | HR 97 | Temp 97.7°F | Resp 18

## 2024-03-26 DIAGNOSIS — M810 Age-related osteoporosis without current pathological fracture: Secondary | ICD-10-CM | POA: Diagnosis not present

## 2024-03-26 MED ORDER — DENOSUMAB 60 MG/ML ~~LOC~~ SOSY
60.0000 mg | PREFILLED_SYRINGE | Freq: Once | SUBCUTANEOUS | Status: AC
  Administered 2024-03-26: 60 mg via SUBCUTANEOUS

## 2024-03-26 NOTE — Progress Notes (Signed)
 Diagnosis: Osteoporosis  Provider:  Artemisa Bile MD  Procedure: Injection  Prolia  (Denosumab ), Dose: 60 mg, Site: subcutaneous, Number of injections: 1  Injection Site(s): Right arm  Post Care: Observation period completed  Discharge: Condition: Good, Destination: Home . AVS Provided  Performed by:  Jerad Dunlap Ragsdale, RN

## 2024-06-12 DIAGNOSIS — H43393 Other vitreous opacities, bilateral: Secondary | ICD-10-CM | POA: Diagnosis not present

## 2024-07-04 ENCOUNTER — Encounter: Payer: Self-pay | Admitting: Radiology

## 2024-08-19 ENCOUNTER — Other Ambulatory Visit: Payer: Self-pay | Admitting: Internal Medicine

## 2024-08-21 ENCOUNTER — Telehealth: Payer: Self-pay

## 2024-08-21 NOTE — Telephone Encounter (Signed)
 Auth Submission: APPROVED Site of care: Site of care: [facility] Payer: [facility] medicare Medication & CPT/J Code(s) submitted: Prolia  (Denosumab ) [id number] Diagnosis Code:  Route of submission (phone, fax, portal): portal Phone # Fax # Auth type: Buy/Bill PB Units/visits requested: 60mg  q35months  Reference number: [id number] Approval from: 08/21/24 to 08/21/2025

## 2024-09-15 ENCOUNTER — Encounter: Payer: Self-pay | Admitting: Radiology

## 2024-09-29 ENCOUNTER — Encounter: Attending: Internal Medicine | Admitting: Emergency Medicine

## 2024-09-29 VITALS — BP 115/68 | Temp 97.6°F | Resp 16

## 2024-09-29 DIAGNOSIS — M810 Age-related osteoporosis without current pathological fracture: Secondary | ICD-10-CM | POA: Diagnosis not present

## 2024-09-29 MED ORDER — DENOSUMAB 60 MG/ML ~~LOC~~ SOSY
60.0000 mg | PREFILLED_SYRINGE | Freq: Once | SUBCUTANEOUS | Status: AC
  Administered 2024-09-29: 60 mg via SUBCUTANEOUS

## 2024-09-29 NOTE — Progress Notes (Signed)
 Diagnosis: Osteoporosis  Provider:  Sheryle Carwin MD  Procedure: Injection  Prolia  (Denosumab ), Dose: 60 mg, Site: subcutaneous, Number of injections: 1  Injection Site(s): Left arm  Post Care: Patient declined observation  Discharge: Condition: Good, Destination: Home . AVS Provided  Performed by:  Delon ONEIDA Officer, RN

## 2024-11-17 ENCOUNTER — Encounter (HOSPITAL_COMMUNITY): Payer: Self-pay

## 2024-11-17 ENCOUNTER — Emergency Department (HOSPITAL_COMMUNITY)

## 2024-11-17 ENCOUNTER — Emergency Department (HOSPITAL_COMMUNITY)
Admission: EM | Admit: 2024-11-17 | Discharge: 2024-11-17 | Disposition: A | Discharge status: Home / Self Care | Attending: Emergency Medicine | Admitting: Emergency Medicine

## 2024-11-17 DIAGNOSIS — R0602 Shortness of breath: Secondary | ICD-10-CM | POA: Insufficient documentation

## 2024-11-17 DIAGNOSIS — R42 Dizziness and giddiness: Secondary | ICD-10-CM | POA: Insufficient documentation

## 2024-11-17 DIAGNOSIS — Z7951 Long term (current) use of inhaled steroids: Secondary | ICD-10-CM | POA: Insufficient documentation

## 2024-11-17 DIAGNOSIS — R55 Syncope and collapse: Secondary | ICD-10-CM | POA: Diagnosis present

## 2024-11-17 DIAGNOSIS — R0789 Other chest pain: Secondary | ICD-10-CM | POA: Diagnosis not present

## 2024-11-17 DIAGNOSIS — R918 Other nonspecific abnormal finding of lung field: Secondary | ICD-10-CM | POA: Insufficient documentation

## 2024-11-17 DIAGNOSIS — J45909 Unspecified asthma, uncomplicated: Secondary | ICD-10-CM | POA: Insufficient documentation

## 2024-11-17 LAB — RESP PANEL BY RT-PCR (RSV, FLU A&B, COVID)  RVPGX2
Influenza A by PCR: NEGATIVE
Influenza B by PCR: NEGATIVE
Resp Syncytial Virus by PCR: NEGATIVE
SARS Coronavirus 2 by RT PCR: NEGATIVE

## 2024-11-17 LAB — HEPATIC FUNCTION PANEL
ALT: 23 U/L (ref 0–44)
AST: 25 U/L (ref 15–41)
Albumin: 4.4 g/dL (ref 3.5–5.0)
Alkaline Phosphatase: 42 U/L (ref 38–126)
Bilirubin, Direct: 0.2 mg/dL (ref 0.0–0.2)
Indirect Bilirubin: 0.4 mg/dL (ref 0.3–0.9)
Total Bilirubin: 0.6 mg/dL (ref 0.0–1.2)
Total Protein: 7.4 g/dL (ref 6.5–8.1)

## 2024-11-17 LAB — TROPONIN T, HIGH SENSITIVITY
Troponin T High Sensitivity: 18 ng/L (ref 0–19)
Troponin T High Sensitivity: 19 ng/L (ref 0–19)

## 2024-11-17 LAB — D-DIMER, QUANTITATIVE: D-Dimer, Quant: 0.79 ug{FEU}/mL — ABNORMAL HIGH (ref 0.00–0.50)

## 2024-11-17 LAB — CBC
HCT: 39.9 % (ref 36.0–46.0)
Hemoglobin: 13.3 g/dL (ref 12.0–15.0)
MCH: 31 pg (ref 26.0–34.0)
MCHC: 33.3 g/dL (ref 30.0–36.0)
MCV: 93 fL (ref 80.0–100.0)
Platelets: 296 K/uL (ref 150–400)
RBC: 4.29 MIL/uL (ref 3.87–5.11)
RDW: 13.1 % (ref 11.5–15.5)
WBC: 5.9 K/uL (ref 4.0–10.5)
nRBC: 0 % (ref 0.0–0.2)

## 2024-11-17 LAB — BASIC METABOLIC PANEL WITH GFR
Anion gap: 8 (ref 5–15)
BUN: 22 mg/dL (ref 8–23)
CO2: 28 mmol/L (ref 22–32)
Calcium: 9.2 mg/dL (ref 8.9–10.3)
Chloride: 103 mmol/L (ref 98–111)
Creatinine, Ser: 0.84 mg/dL (ref 0.44–1.00)
GFR, Estimated: >60 mL/min
Glucose, Bld: 99 mg/dL (ref 70–99)
Potassium: 4.3 mmol/L (ref 3.5–5.1)
Sodium: 139 mmol/L (ref 135–145)

## 2024-11-17 MED ORDER — SODIUM CHLORIDE 0.9 % IV BOLUS
500.0000 mL | Freq: Once | INTRAVENOUS | Status: AC
  Administered 2024-11-17: 500 mL via INTRAVENOUS

## 2024-11-17 MED ORDER — IOHEXOL 350 MG/ML SOLN
75.0000 mL | Freq: Once | INTRAVENOUS | Status: AC | PRN
  Administered 2024-11-17: 75 mL via INTRAVENOUS

## 2024-11-17 NOTE — ED Provider Notes (Signed)
" °  Physical Exam  BP 134/70 (BP Location: Left Arm)   Pulse 80   Temp 98.1 F (36.7 C) (Oral)   Resp 18   Ht 5' 3 (1.6 m)   Wt 63 kg   SpO2 99%   BMI 24.62 kg/m   Physical Exam  Procedures  Procedures  ED Course / MDM    Medical Decision Making Amount and/or Complexity of Data Reviewed Labs: ordered. Radiology: ordered.  Risk Prescription drug management.   Received in signout.  Check CT and troponin.  Workup reassuring.  However CT did show potential tree-in-bud and nodule.  Will need follow-up CT.  Can follow-up PCP.  Will discharge.       Patsey Lot, MD 11/17/24 2313  "

## 2024-11-17 NOTE — Discharge Instructions (Addendum)
 Follow-up with your family doctor this week.  You have also been referred to cardiology for follow-up.  Return if any problems  Follow-up with your family doctor in 3 to 6 months for recheck of the CAT scan.

## 2024-11-17 NOTE — ED Triage Notes (Addendum)
 Pt c/o several episodes of light headedness and chest tightness starting this morning.  Pt reports she was walking w/ friends when first episode happened.  Hx of asthma.  Pt took inhaler w/o relief.     Pt took 81 aspirin prior to arrival.    Pt noted to be very anxious.

## 2024-11-17 NOTE — ED Provider Notes (Signed)
 " [place] EMERGENCY DEPARTMENT AT [facility] Provider Note   CSN: [id number] Arrival date & time: 11/17/24  0941     Patient presents with: Near Syncope and Chest Pain   Mckenzie Collins is a 84 y.o. female.   Patient has a history of asthma.  She states she was walking and she felt little chest tightness and shortness of breath.  No sweating no fever.  Patient has no pain now.  She also felt a little bit dizzy  The history is provided by the patient and medical records. No language interpreter was used.  Near Syncope This is a new problem. The current episode started 1 to 2 hours ago. The problem occurs rarely. The problem has been rapidly improving. Associated symptoms include chest pain. Pertinent negatives include no abdominal pain and no headaches. Nothing aggravates the symptoms. Nothing relieves the symptoms. The treatment provided no relief.       Prior to Admission medications  Medication Sig Start Date End Date Taking? Authorizing Provider  acetaminophen  (TYLENOL ) 325 MG tablet Take 650 mg by mouth every 6 (six) hours as needed.    [provider]  albuterol  (VENTOLIN  HFA) 108 (90 Base) MCG/ACT inhaler Inhale 2 puffs into the lungs every 6 (six) hours as needed for wheezing or shortness of breath. 11/27/23   Darlean Ozell NOVAK, MD  aluminum hydroxide-magnesium carbonate (GAVISCON) 95-358 MG/15ML SUSP Take by mouth.    [provider]  budesonide -formoterol  (SYMBICORT ) 80-4.5 MCG/ACT inhaler Inhale 2 puffs into the lungs 2 (two) times daily. Patient taking differently: Inhale 1-2 puffs into the lungs every morning. 01/08/18   Darlean Ozell NOVAK, MD  Calcium  Carb-Cholecalciferol (CALTRATE 600+D3 PO) Take 1 tablet by mouth in the morning and at bedtime.    [provider]  calcium  elemental as carbonate (TUMS ULTRA 1000) 400 MG chewable tablet Chew 1,000 mg by mouth daily as needed for heartburn.    [provider]  cholecalciferol (VITAMIN  D3) 25 MCG (1000 UNIT) tablet Take 1,000 Units by mouth daily.    [provider]  cycloSPORINE (RESTASIS) 0.05 % ophthalmic emulsion 1 drop 2 (two) times daily.    [provider]  ibuprofen (ADVIL) 200 MG tablet Take 200 mg by mouth every 6 (six) hours as needed.    [provider]  loratadine (CLARITIN) 10 MG tablet Take 10 mg by mouth daily.    [provider]  Multiple Vitamins-Minerals (CENTRUM SILVER  ULTRA WOMENS PO) Take 1 tablet by mouth daily.    [provider]  pantoprazole  (PROTONIX ) 40 MG tablet Take 40 mg by mouth every morning. 11/21/23   [provider]  Propylene Glycol (SYSTANE BALANCE) 0.6 % SOLN Place 1 drop into both eyes 2 (two) times daily as needed (dry eyes).    [provider]    Allergies: Patient has no known allergies.    Review of Systems  Constitutional:  Negative for appetite change and fatigue.  HENT:  Negative for congestion, ear discharge and sinus pressure.   Eyes:  Negative for discharge.  Respiratory:  Negative for cough.        Short of breath  Cardiovascular:  Positive for chest pain.  Gastrointestinal:  Negative for abdominal pain and diarrhea.  Genitourinary:  Negative for frequency and hematuria.  Musculoskeletal:  Negative for back pain.  Skin:  Negative for rash.  Neurological:  Negative for seizures and headaches.  Psychiatric/Behavioral:  Negative for hallucinations.     Updated Vital  Signs BP 134/72 (BP Location: Right Arm)   Pulse 82   Temp 98.1 F (36.7 C) (Oral)   Resp 18   Ht 5' 3 (1.6 m)   Wt 63 kg   SpO2 99%   BMI 24.62 kg/m   Physical Exam Vitals and nursing note reviewed.  Constitutional:      Appearance: She is well-developed.  HENT:     Head: Normocephalic.     Nose: Nose normal.  Eyes:     General: No scleral icterus.    Conjunctiva/sclera: Conjunctivae normal.  Neck:     Thyroid : No thyromegaly.  Cardiovascular:     Rate and Rhythm: Normal rate  and regular rhythm.     Heart sounds: No murmur heard.    No friction rub. No gallop.  Pulmonary:     Breath sounds: No stridor. No wheezing or rales.  Chest:     Chest wall: No tenderness.  Abdominal:     General: There is no distension.     Tenderness: There is no abdominal tenderness. There is no rebound.  Musculoskeletal:        General: Normal range of motion.     Cervical back: Neck supple.  Lymphadenopathy:     Cervical: No cervical adenopathy.  Skin:    Findings: No erythema or rash.  Neurological:     Mental Status: She is alert and oriented to person, place, and time.     Motor: No abnormal muscle tone.     Coordination: Coordination normal.  Psychiatric:        Behavior: Behavior normal.     (all labs ordered are listed, but only abnormal results are displayed) Labs Reviewed  D-DIMER, QUANTITATIVE - Abnormal; Notable for the following components:      Result Value   D-Dimer, Quant 0.79 (*)    All other components within normal limits  RESP PANEL BY RT-PCR (RSV, FLU A&B, COVID)  [id number]  BASIC METABOLIC PANEL WITH GFR  CBC  HEPATIC FUNCTION PANEL  TROPONIN T, HIGH SENSITIVITY  TROPONIN T, HIGH SENSITIVITY    EKG: EKG Interpretation Date/Time:  Monday November 17 2024 10:14:19 EST Ventricular Rate:  98 PR Interval:  170 QRS Duration:  76 QT Interval:  338 QTC Calculation: 431 R Axis:   68  Text Interpretation: Normal sinus rhythm Normal ECG No previous ECGs available Confirmed by Suzette Pac [phone]) on 11/17/2024 11:47:48 AM  Radiology: [facility] Chest 2 View Result Date: 11/17/2024 CLINICAL DATA:  Chest tightness EXAM: CHEST - 2 VIEW COMPARISON:  04/05/2022 FINDINGS: Similar hyperinflation compatible with component of COPD/emphysema. Biapical mild pleuroparenchymal scarring again noted. No superimposed acute pneumonia, collapse or consolidation. Negative for edema, effusion, or pneumothorax. Trachea midline. Normal heart size and vascularity. No osseous  abnormality. IMPRESSION: Stable chest exam. No acute process by plain radiography. Electronically Signed   By: CHRISTELLA.  Shick M.D.   On: 11/17/2024 11:28     Procedures   Medications Ordered in the ED  sodium chloride  0.9 % bolus 500 mL (0 mLs Intravenous Stopped 11/17/24 1334)  iohexol  (OMNIPAQUE ) 350 MG/ML injection 75 mL (75 mLs Intravenous Contrast Given 11/17/24 1454)     EKG, troponins x 2, chest x-ray all unremarkable.  CT chest pending                               Medical Decision Making Amount and/or Complexity of Data Reviewed Labs: ordered. Radiology: ordered.  Risk  Prescription drug management.   Patient with some shortness of breath and chest tightness that has resolved.  With normal EKG and troponins she will be discharged home to follow-up with her family doctor and cardiology     Final diagnoses:  None    ED Discharge Orders     None          Suzette Pac, MD 11/18/24 1738  "

## 2024-11-20 ENCOUNTER — Ambulatory Visit: Attending: Internal Medicine

## 2024-11-20 ENCOUNTER — Ambulatory Visit: Attending: Internal Medicine | Admitting: Internal Medicine

## 2024-11-20 ENCOUNTER — Encounter: Payer: Self-pay | Admitting: Internal Medicine

## 2024-11-20 ENCOUNTER — Other Ambulatory Visit: Payer: Self-pay | Admitting: Internal Medicine

## 2024-11-20 ENCOUNTER — Telehealth: Payer: Self-pay | Admitting: Internal Medicine

## 2024-11-20 VITALS — BP 126/80 | HR 88 | Ht 63.0 in | Wt 137.2 lb

## 2024-11-20 DIAGNOSIS — R079 Chest pain, unspecified: Secondary | ICD-10-CM

## 2024-11-20 DIAGNOSIS — R002 Palpitations: Secondary | ICD-10-CM

## 2024-11-20 DIAGNOSIS — R0789 Other chest pain: Secondary | ICD-10-CM | POA: Diagnosis not present

## 2024-11-20 MED ORDER — ISOSORBIDE MONONITRATE ER 30 MG PO TB24: 15.0000 mg | ORAL_TABLET | Freq: Every day | ORAL | 2 refills | Status: AC

## 2024-11-20 NOTE — Patient Instructions (Addendum)
 Medication Instructions:  Your physician has recommended you make the following change in your medication:  Start taking Imdur  15 mg once daily  Continue taking all other medications as prescribed   Labwork: None  Testing/Procedures: Your physician has requested that you have an echocardiogram. Echocardiography is a painless test that uses sound waves to create images of your heart. It provides your doctor with information about the size and shape of your heart and how well your hearts chambers and valves are working. This procedure takes approximately one hour. There are no restrictions for this procedure. Please do NOT wear cologne, perfume, aftershave, or lotions (deodorant is allowed). Please arrive 15 minutes prior to your appointment time.  Please note: We ask at that you not bring children with you during ultrasound (echo/ vascular) testing. Due to room size and safety concerns, children are not allowed in the ultrasound rooms during exams. Our front office staff cannot provide observation of children in our lobby area while testing is being conducted. An adult accompanying a patient to their appointment will only be allowed in the ultrasound room at the discretion of the ultrasound technician under special circumstances. We apologize for any inconvenience.  Your physician has recommended that you wear a Zio monitor.   This monitor is a medical device that records the hearts electrical activity. Doctors most often use these monitors to diagnose arrhythmias. Arrhythmias are problems with the speed or rhythm of the heartbeat. The monitor is a small device applied to your chest. You can wear one while you do your normal daily activities. While wearing this monitor if you have any symptoms to push the button and record what you felt. Once you have worn this monitor for the period of time provider prescribed (for *14 days), you will return the monitor device in the postage paid box. Once it is  returned they will download the data collected and provide us  with a report which the provider will then review and we will call you with those results. Important tips:  Avoid showering during the first 24 hours of wearing the monitor. Avoid excessive sweating to help maximize wear time. Do not submerge the device, no hot tubs, and no swimming pools. Keep any lotions or oils away from the patch. After 24 hours you may shower with the patch on. Take brief showers with your back facing the shower head.  Do not remove patch once it has been placed because that will interrupt data and decrease adhesive wear time. Push the button when you have any symptoms and write down what you were feeling. Once you have completed wearing your monitor, remove and place into box which has postage paid and place in your outgoing mailbox.  If for some reason you have misplaced your box then call our office and we can provide another box and/or mail it off for you.  Your physician has requested that you have a lexiscan  myoview . For further information please visit [email or web address] Please follow instruction sheet, as given.   Follow-Up: Your physician recommends that you schedule a follow-up appointment in: 3 months  Any Other Special Instructions Will Be Listed Below (If Applicable). Thank you for choosing [facility]!     If you need a refill on your cardiac medications before your next appointment, please call your pharmacy.

## 2024-11-20 NOTE — Telephone Encounter (Signed)
 Checking percert on the following patient for testing scheduled at [facility].     Lexiscan  Dx: Chest Pain  --   11/28/2024   2 week Live Monitor Dx: Palpitations

## 2024-11-20 NOTE — Progress Notes (Signed)
 "    Cardiology Office Note  Date: 11/20/2024   ID: Mckenzie Collins, Mckenzie Collins July 14, 1941, MRN [id number]  PCP:  Sheryle Carwin, MD  Cardiologist:  Diannah SHAUNNA Maywood, MD Electrophysiologist:  None   History of Present Illness: Mckenzie Collins is a 84 y.o. female with no significant past medical history was referred to cardiology clinic for evaluation of chest tightness post ER visit.  Patient was walking when she felt dizzy followed by waxing and waning of chest tightness prompting ER visit on 11/17/2024.  This episode scared her and that this never happened before.  She has a history of asthma and uses inhalers.  She reports that this chest tightness did not feel like asthma.  Per chart review, she had similar episode of chest tightness in the past when she was evaluated by cardiology in 2023.  Echocardiogram was normal and CT cardiac at that time showed mild CAD.  She went to see Dr. Sheryle this morning.  He does not have any worsening DOE.  She reports having palpitations that feels like 8.  Occurs multiple times throughout the day.  Last for few seconds.  No syncope.  No leg swelling.  Past Medical History:  Diagnosis Date   Anxiety    Asthma    uses symbicort , uses albuterol  maybe once a month   Breast mass left   skin lesion around 11-12 x 2 -3 weeks   Chest pain    history of   Dyspnea 2005   Negative stress nuclear in 2005; normal echocardiogram   Family history of breast cancer 05/05/2022   Family history of pancreatic cancer 05/05/2022   Gastroesophageal reflux disease    hiatal hernia; gastritis; dysphagia   Hemorrhoids    Inverted nipple left   always   Low back pain    Microscopic hematuria    Osteoporosis    Palpitations 2003   few short runs of supraventricular tachycardia on Holter   Spondylolisthesis of cervical region     Past Surgical History:  Procedure Laterality Date   CATARACT EXTRACTION Bilateral    COLONOSCOPY  2011   COLONOSCOPY WITH PROPOFOL  N/A 12/08/2020    Procedure: COLONOSCOPY WITH PROPOFOL ;  Surgeon: Golda Claudis PENNER, MD;  Location: [facility];  Service: Endoscopy;  Laterality: N/A;  8:30   ROBOTIC ASSISTED BILATERAL SALPINGO OOPHERECTOMY Bilateral 04/05/2022   Procedure: Xi robotic assisted salpingo oophorectomy, vaginal biopsy;  Surgeon: Viktoria Comer SAUNDERS, MD;  Location: [facility];  Service: Gynecology;  Laterality: Bilateral;   TONSILLECTOMY      Current Outpatient Medications  Medication Sig Dispense Refill   acetaminophen  (TYLENOL ) 325 MG tablet Take 650 mg by mouth every 6 (six) hours as needed.     albuterol  (VENTOLIN  HFA) 108 (90 Base) MCG/ACT inhaler Inhale 2 puffs into the lungs every 6 (six) hours as needed for wheezing or shortness of breath. 18 g 3   aluminum hydroxide-magnesium carbonate (GAVISCON) 95-358 MG/15ML SUSP Take by mouth.     aspirin EC 81 MG tablet Take 81 mg by mouth daily. Swallow whole.     budesonide -formoterol  (SYMBICORT ) 80-4.5 MCG/ACT inhaler Inhale 2 puffs into the lungs 2 (two) times daily. 1 Inhaler 11   Calcium  Carb-Cholecalciferol (CALTRATE 600+D3 PO) Take 1 tablet by mouth in the morning and at bedtime.     calcium  elemental as carbonate (TUMS ULTRA 1000) 400 MG chewable tablet Chew 1,000 mg by mouth daily as needed for heartburn.     cholecalciferol (VITAMIN D3) 25 MCG (  1000 UNIT) tablet Take 1,000 Units by mouth daily.     cycloSPORINE (RESTASIS) 0.05 % ophthalmic emulsion 1 drop 2 (two) times daily.     ibuprofen (ADVIL) 200 MG tablet Take 200 mg by mouth every 6 (six) hours as needed.     loratadine (CLARITIN) 10 MG tablet Take 10 mg by mouth daily.     Multiple Vitamins-Minerals (CENTRUM SILVER  ULTRA WOMENS PO) Take 1 tablet by mouth daily.     pantoprazole  (PROTONIX ) 40 MG tablet Take 40 mg by mouth every morning.     Propylene Glycol (SYSTANE BALANCE) 0.6 % SOLN Place 1 drop into both eyes 2 (two) times daily as needed (dry eyes).     No current facility-administered medications for this visit.    Allergies:  Patient has no known allergies.   Social History: The patient  reports that she has never smoked. She has never used smokeless tobacco. She reports current alcohol use. She reports that she does not use drugs.   Family History: The patient's family history includes Alzheimer's disease in her father, paternal grandfather, and sister; Breast cancer in her paternal aunt; Congenital heart disease in her brother; Pancreatic cancer in her father.   ROS:  Please see the history of present illness. Otherwise, complete review of systems is positive for none  All other systems are reviewed and negative.   Physical Exam: VS:  BP 126/80 (BP Location: Left Arm, Patient Position: Sitting, Cuff Size: Normal)   Pulse 88   Ht 5' 3 (1.6 m)   Wt 137 lb 3.2 oz (62.2 kg)   SpO2 97%   BMI 24.30 kg/m , BMI Body mass index is 24.3 kg/m.  Wt Readings from Last 3 Encounters:  11/20/24 137 lb 3.2 oz (62.2 kg)  11/17/24 139 lb (63 kg)  01/09/24 139 lb 6.4 oz (63.2 kg)    General: Patient appears comfortable at rest. HEENT: Conjunctiva and lids normal, oropharynx clear with moist mucosa. Neck: Supple, no elevated JVP or carotid bruits, no thyromegaly. Lungs: Clear to auscultation, nonlabored breathing at rest. Cardiac: Regular rate and rhythm, no S3 or significant systolic murmur, no pericardial rub. Abdomen: Soft, nontender, no hepatomegaly, bowel sounds present, no guarding or rebound. Extremities: No pitting edema, distal pulses 2+. Skin: Warm and dry. Musculoskeletal: No kyphosis. Neuropsychiatric: Alert and oriented x3, affect grossly appropriate.  Recent Labwork: 11/17/2024: ALT 23; AST 25; BUN 22; Creatinine, Ser 0.84; Hemoglobin 13.3; Platelets 296; Potassium 4.3; Sodium 139  No results found for: CHOL, TRIG, HDL, CHOLHDL, VLDL, LDLCALC, LDLDIRECT   Assessment and Plan:  Chest tightness: Patient had dizziness followed by waxing waning of chest tightness prompting ER  visit on 11/17/2024.  This episode scared her and this never happened before. cardiac workup was unremarkable.    Per chart review, she did have similar symptoms previously where she was evaluated by cardiology in 2023.  CT cardiac from 2023 showed mild CAD.  Patient requesting for stress test.  Will order Lexiscan  and update echocardiogram (previous echocardiogram showed mild AR and normal LVEF).  Unclear if she has any component of coronary microvascular dysfunction/coronary vasospasm.  Will improve to start her on isosorbide  mononitrate 30 mg once daily and re-evaluate her symptoms.  Palpitations: Palpitations occur multiple times throughout the day and lasting for few seconds.  It feels like a ache.  Obtain 2-week event monitor, live.    I spent 45 minutes in reviewing prior medical records, reports, more than 3 labs, discussion and documentation.  Medication Adjustments/Labs and Tests Ordered: Current medicines are reviewed at length with the patient today.  Concerns regarding medicines are outlined above.    Disposition:  Follow up 3 months  Signed Virgilene Stryker Priya Caylah Plouff, MD, 11/20/2024 3:59 PM    [facility] [address] [place], [place], [place] [address]  "

## 2024-11-21 ENCOUNTER — Telehealth: Payer: Self-pay | Admitting: Internal Medicine

## 2024-11-21 NOTE — Telephone Encounter (Signed)
 Spoke to patient who stated that she will leave stress test as Lexiscan  since she thinks she may be unable to jog on a treadmill.

## 2024-11-21 NOTE — Telephone Encounter (Signed)
 Patient would like to know if stress test order can be changed to a treadmill test instead of the injection. Please advise.

## 2024-11-28 ENCOUNTER — Telehealth: Payer: Self-pay | Admitting: Student

## 2024-11-28 ENCOUNTER — Encounter (HOSPITAL_COMMUNITY)
Admission: RE | Admit: 2024-11-28 | Discharge: 2024-11-28 | Disposition: A | Discharge status: Home / Self Care | Source: Ambulatory Visit | Attending: Internal Medicine | Admitting: Internal Medicine

## 2024-11-28 ENCOUNTER — Encounter (HOSPITAL_BASED_OUTPATIENT_CLINIC_OR_DEPARTMENT_OTHER)
Admission: RE | Admit: 2024-11-28 | Discharge: 2024-11-28 | Disposition: A | Source: Ambulatory Visit | Attending: Internal Medicine

## 2024-11-28 DIAGNOSIS — R079 Chest pain, unspecified: Secondary | ICD-10-CM | POA: Diagnosis present

## 2024-11-28 LAB — NM MYOCAR MULTI W/SPECT W/WALL MOTION / EF
Base ST Depression (mm): 0 mm
Estimated workload: 1
Exercise duration (min): 0 min
Exercise duration (sec): 0 s
LV dias vol: 55 mL (ref 46–106)
LV sys vol: 13 mL
MPHR: 139 {beats}/min
Nuc Stress EF: 76 %
Peak HR: 125 {beats}/min
Percent HR: 89 %
RATE: 0.3
Rest HR: 98 {beats}/min
Rest Nuclear Isotope Dose: 10.6 mCi
SDS: 2
SRS: 0
SSS: 2
ST Depression (mm): 0 mm
Stress Nuclear Isotope Dose: 32.2 mCi
TID: 1.37

## 2024-11-28 MED ORDER — SODIUM CHLORIDE FLUSH 0.9 % IV SOLN
INTRAVENOUS | Status: AC
  Filled 2024-11-28: qty 10

## 2024-11-28 MED ORDER — TECHNETIUM TC 99M TETROFOSMIN IV KIT
10.6000 | PACK | Freq: Once | INTRAVENOUS | Status: AC | PRN
  Administered 2024-11-28: 10.6 via INTRAVENOUS

## 2024-11-28 MED ORDER — REGADENOSON 0.4 MG/5ML IV SOLN
INTRAVENOUS | Status: AC
  Administered 2024-11-28: 0.4 mg via INTRAVENOUS
  Filled 2024-11-28: qty 5

## 2024-11-28 MED ORDER — TECHNETIUM TC 99M TETROFOSMIN IV KIT
32.2000 | PACK | Freq: Once | INTRAVENOUS | Status: AC | PRN
  Administered 2024-11-28: 32.2 via INTRAVENOUS

## 2024-11-28 NOTE — Telephone Encounter (Signed)
" °  ° °  Mckenzie Collins presented for a Lexiscan  nuclear stress test today.  I Laymon CHRISTELLA Qua, PA-C, provided direct supervision and was present during the stress portion of the study today, which was completed without significant symptoms, immediate complications, or acute ST/T changes on ECG.  Stress imaging is pending at this time.  Preliminary ECG findings may be listed in the chart, but the stress test result will not be finalized until perfusion imaging is complete.  Laymon CHRISTELLA Qua, PA-C  11/28/2024, 10:36 AM    "

## 2024-12-01 ENCOUNTER — Ambulatory Visit: Payer: Self-pay | Admitting: Internal Medicine

## 2024-12-10 ENCOUNTER — Ambulatory Visit: Attending: Internal Medicine

## 2024-12-10 DIAGNOSIS — R0789 Other chest pain: Secondary | ICD-10-CM

## 2024-12-10 LAB — ECHOCARDIOGRAM COMPLETE
AR max vel: 3.11 cm2
AV Peak grad: 4.3 mmHg
AV Vena cont: 0.7 cm
Ao pk vel: 1.04 m/s
Area-P 1/2: 5.07 cm2
Calc EF: 58 %
P 1/2 time: 331 ms
S' Lateral: 2.7 cm
Single Plane A2C EF: 61.6 %
Single Plane A4C EF: 54.6 %

## 2024-12-19 NOTE — Telephone Encounter (Signed)
-----   Message from Vishnu Mallipeddi, MD sent at 12/19/2024  1:07 PM EST ----- Normal LV function with hypokinesis of the septum, normal RV function, moderate AR and CVP 3 mmHg.  AR progressed from mild to moderate now which we will monitor with serial ehocardiograms serially.   There is some hypokinesis of the septum but she does not have any severe CAD by CT cardiac.  Will reevaluate symptoms in the next OV as we started Imdur  recently.

## 2024-12-19 NOTE — Telephone Encounter (Signed)
 The patient has been notified of the result and verbalized understanding.  All questions (if any) were answered. Spoke with patient regarding her results. She stated that she hasn't been taking the Imdur . She isn't having chest pain its more of an ache. Would like to know if she really needs to take this medication? Doesn't have enough pain to be taking medication for it. Please advise.  Littie CHRISTELLA Croak, CMA 12/19/2024 4:57 PM

## 2025-02-25 ENCOUNTER — Ambulatory Visit: Admitting: Internal Medicine

## 2025-03-31 ENCOUNTER — Ambulatory Visit
